# Patient Record
Sex: Male | Born: 1960 | Race: Black or African American | Hispanic: No | Marital: Married | Smoking: Never smoker
Health system: Southern US, Community
[De-identification: ages and names within clinical notes are randomized; demographics above are authoritative.]

## PROBLEM LIST (undated history)

## (undated) DIAGNOSIS — I10 Essential (primary) hypertension: Secondary | ICD-10-CM

## (undated) DIAGNOSIS — R519 Headache, unspecified: Secondary | ICD-10-CM

## (undated) DIAGNOSIS — Z973 Presence of spectacles and contact lenses: Secondary | ICD-10-CM

## (undated) DIAGNOSIS — E119 Type 2 diabetes mellitus without complications: Secondary | ICD-10-CM

## (undated) DIAGNOSIS — M1990 Unspecified osteoarthritis, unspecified site: Secondary | ICD-10-CM

## (undated) DIAGNOSIS — K5790 Diverticulosis of intestine, part unspecified, without perforation or abscess without bleeding: Secondary | ICD-10-CM

## (undated) DIAGNOSIS — M109 Gout, unspecified: Secondary | ICD-10-CM

## (undated) DIAGNOSIS — R51 Headache: Secondary | ICD-10-CM

## (undated) DIAGNOSIS — E669 Obesity, unspecified: Secondary | ICD-10-CM

## (undated) HISTORY — PX: COLONOSCOPY: SHX174

## (undated) HISTORY — DX: Essential (primary) hypertension: I10

## (undated) HISTORY — DX: Gout, unspecified: M10.9

---

## 2001-10-28 ENCOUNTER — Encounter: Payer: Self-pay | Admitting: Emergency Medicine

## 2001-10-28 ENCOUNTER — Emergency Department (HOSPITAL_COMMUNITY): Admission: EM | Admit: 2001-10-28 | Discharge: 2001-10-28 | Payer: Self-pay | Admitting: Emergency Medicine

## 2004-03-10 ENCOUNTER — Emergency Department (HOSPITAL_COMMUNITY): Admission: EM | Admit: 2004-03-10 | Discharge: 2004-03-10 | Payer: Self-pay | Admitting: Emergency Medicine

## 2004-11-16 ENCOUNTER — Ambulatory Visit: Payer: Self-pay | Admitting: Internal Medicine

## 2004-12-26 ENCOUNTER — Ambulatory Visit: Payer: Self-pay | Admitting: Internal Medicine

## 2008-07-07 ENCOUNTER — Encounter (INDEPENDENT_AMBULATORY_CARE_PROVIDER_SITE_OTHER): Payer: Self-pay | Admitting: *Deleted

## 2008-07-07 ENCOUNTER — Ambulatory Visit: Payer: Self-pay | Admitting: Internal Medicine

## 2008-07-10 LAB — CONVERTED CEMR LAB
BUN: 13 mg/dL (ref 6–23)
CO2: 27 meq/L (ref 19–32)
Calcium: 9.1 mg/dL (ref 8.4–10.5)
Chloride: 108 meq/L (ref 96–112)
Creatinine, Ser: 1 mg/dL (ref 0.4–1.5)
GFR calc Af Amer: 103 mL/min
GFR calc non Af Amer: 86 mL/min
Glucose, Bld: 78 mg/dL (ref 70–99)
Potassium: 3.9 meq/L (ref 3.5–5.1)
Sodium: 142 meq/L (ref 135–145)

## 2008-08-19 ENCOUNTER — Ambulatory Visit: Payer: Self-pay | Admitting: Internal Medicine

## 2008-08-23 ENCOUNTER — Encounter (INDEPENDENT_AMBULATORY_CARE_PROVIDER_SITE_OTHER): Payer: Self-pay | Admitting: *Deleted

## 2009-02-17 ENCOUNTER — Ambulatory Visit: Payer: Self-pay | Admitting: Internal Medicine

## 2009-08-25 ENCOUNTER — Telehealth (INDEPENDENT_AMBULATORY_CARE_PROVIDER_SITE_OTHER): Payer: Self-pay | Admitting: *Deleted

## 2010-10-15 LAB — HM COLONOSCOPY: HM Colonoscopy: NORMAL

## 2010-11-12 LAB — CONVERTED CEMR LAB
ALT: 24 U/L
AST: 17 U/L
BUN: 12 mg/dL
Basophils Absolute: 0 K/uL
Basophils Relative: 0.6 %
CO2: 30 meq/L
Calcium: 8.8 mg/dL
Chloride: 106 meq/L
Cholesterol: 170 mg/dL
Creatinine, Ser: 1 mg/dL
Eosinophils Absolute: 0.4 K/uL
Eosinophils Relative: 7 % — ABNORMAL HIGH
GFR calc Af Amer: 103 mL/min
GFR calc non Af Amer: 86 mL/min
Glucose, Bld: 96 mg/dL
HCT: 43.9 %
HDL: 27.9 mg/dL — ABNORMAL LOW
Hemoglobin: 15.2 g/dL
LDL Cholesterol: 129 mg/dL — ABNORMAL HIGH
Lymphocytes Relative: 29.3 %
MCHC: 34.6 g/dL
MCV: 92.4 fL
Monocytes Absolute: 0.2 K/uL
Monocytes Relative: 4.7 %
Neutro Abs: 3.1 K/uL
Neutrophils Relative %: 58.4 %
PSA: 0.52 ng/mL
Platelets: 216 K/uL
Potassium: 3.9 meq/L
RBC: 4.75 M/uL
RDW: 13.1 %
Sodium: 141 meq/L
TSH: 1.17 u[IU]/mL
Total CHOL/HDL Ratio: 6.1
Triglycerides: 64 mg/dL
VLDL: 13 mg/dL
WBC: 5.2 10*3/microliter

## 2010-12-08 ENCOUNTER — Encounter: Payer: Self-pay | Admitting: Internal Medicine

## 2012-10-29 ENCOUNTER — Ambulatory Visit (INDEPENDENT_AMBULATORY_CARE_PROVIDER_SITE_OTHER): Payer: 59 | Admitting: Internal Medicine

## 2012-10-29 ENCOUNTER — Encounter: Payer: Self-pay | Admitting: Internal Medicine

## 2012-10-29 VITALS — BP 142/88 | HR 91 | Temp 98.6°F | Wt 271.0 lb

## 2012-10-29 DIAGNOSIS — I10 Essential (primary) hypertension: Secondary | ICD-10-CM

## 2012-10-29 DIAGNOSIS — M79609 Pain in unspecified limb: Secondary | ICD-10-CM

## 2012-10-29 DIAGNOSIS — M79672 Pain in left foot: Secondary | ICD-10-CM

## 2012-10-29 DIAGNOSIS — M109 Gout, unspecified: Secondary | ICD-10-CM | POA: Insufficient documentation

## 2012-10-29 NOTE — Progress Notes (Signed)
  Subjective:    Patient ID: Ian Wilson, male    DOB: 11-Aug-1961, 51 y.o.   MRN: [id number]  HPI Acute visit Developed pain at the base of the left great toe 3 days ago. Does not recall any specific injury but he admits that he has been doing a lot of walking lately. As far as his history of high blood pressure, a while back he changed his diet and has not taken any medication. Ambulatory BPs are good.  Past Medical History  Diagnosis Date  . HTN (hypertension)    Past Surgical History  Procedure Date  . No past surgeries    History   Social History  . Marital Status: Married    Spouse Name: N/A    Number of Children: 1  . Years of Education: N/A   Occupational History  . Public affairs consultant     Social History Main Topics  . Smoking status: Never Smoker   . Smokeless tobacco: Never Used  . Alcohol Use: No  . Drug Use: No  . Sexually Active: Not on file   Other Topics Concern  . Not on file   Social History Narrative  . No narrative on file     Review of Systems Denies any recent increase in his weight. No fever chills. Denies numbness or a rash in the left foot. The area was slightly swollen.     Objective:   Physical Exam  General -- alert, well-developed, and overweight appearing Lungs -- normal respiratory effort, no intercostal retractions, no accessory muscle use, and normal breath sounds.   Heart-- normal rate, regular rhythm, no murmur, and no gallop.   Extremities--  no pretibial edema bilaterally ; good bilateral pedal pulses right foot normal to inspection and palpation. Left foot without redness, swelling or warmness. He is a slightly tender at the base of the great toe (only tender at the plantar aspect) Psych-- Cognition and judgment appear intact. Alert and cooperative with normal attention span and concentration.  not anxious appearing and not depressed appearing.      Assessment & Plan:

## 2012-10-29 NOTE — Patient Instructions (Addendum)
Get the XR at [facility] [place], corner of [address] (10 minutes form here); they are open 24/7 [address] [facility]  [place], [place] [address] [phone]  Motrin 200 mg 2 tablets every 6 hours as needed for pain. Always take it with food. Watch for stomach side effects (gastritis): nausea, stomach pain, change in the color of stools. Call if not better in few days Schedule a physical at your convenience

## 2012-10-29 NOTE — Assessment & Plan Note (Signed)
Last seen 3 years ago, he was taking benazepril and amlodipine, he quit a while back, he changed his diet and reports that his BP is better, usually less than 140/85.

## 2012-10-29 NOTE — Assessment & Plan Note (Signed)
Acute visit pain, sprain versus gout versus others like a stress fracture. Plan: Check a uric acid and x-ray Treat with Motrin, if not better will need further eval

## 2012-10-30 ENCOUNTER — Encounter: Payer: Self-pay | Admitting: Internal Medicine

## 2012-10-30 LAB — BASIC METABOLIC PANEL
BUN: 15 mg/dL (ref 6–23)
CO2: 27 mEq/L (ref 19–32)
Calcium: 8.8 mg/dL (ref 8.4–10.5)
Chloride: 104 mEq/L (ref 96–112)
Creatinine, Ser: 1.2 mg/dL (ref 0.4–1.5)
GFR: 79.25 mL/min (ref >60.00)
Glucose, Bld: 82 mg/dL (ref 70–99)
Potassium: 3.9 mEq/L (ref 3.5–5.1)
Sodium: 139 mEq/L (ref 135–145)

## 2012-10-30 LAB — URIC ACID: Uric Acid, Serum: 8.3 mg/dL — ABNORMAL HIGH (ref 4.0–7.8)

## 2012-11-06 ENCOUNTER — Encounter: Payer: Self-pay | Admitting: *Deleted

## 2012-11-06 MED ORDER — PREDNISONE 10 MG PO TABS: ORAL_TABLET | ORAL | Status: DC

## 2012-11-06 NOTE — Addendum Note (Signed)
Addended by: Edwena Felty T on: 11/06/2012 04:00 PM   Modules accepted: Orders

## 2012-12-16 ENCOUNTER — Encounter: Payer: 59 | Admitting: Internal Medicine

## 2013-01-26 ENCOUNTER — Encounter: Payer: 59 | Admitting: Internal Medicine

## 2013-02-04 ENCOUNTER — Ambulatory Visit (INDEPENDENT_AMBULATORY_CARE_PROVIDER_SITE_OTHER): Payer: 59 | Admitting: Internal Medicine

## 2013-02-04 ENCOUNTER — Encounter: Payer: Self-pay | Admitting: Internal Medicine

## 2013-02-04 VITALS — BP 136/86 | HR 95 | Temp 98.8°F | Wt 282.0 lb

## 2013-02-04 DIAGNOSIS — M109 Gout, unspecified: Secondary | ICD-10-CM

## 2013-02-04 MED ORDER — INDOMETHACIN 25 MG PO CAPS: 25.0000 mg | ORAL_CAPSULE | Freq: Three times a day (TID) | ORAL | Status: DC

## 2013-02-04 MED ORDER — COLCHICINE 0.6 MG PO TABS: 0.6000 mg | ORAL_TABLET | Freq: Two times a day (BID) | ORAL | Status: DC | PRN

## 2013-02-04 NOTE — Assessment & Plan Note (Signed)
Symptoms consistent with gout: Explained the patient what gout is, treatment should be weight loss, appropriate diet, as needed colchicine or Indocin. Information about appropriate diet  provided. If symptoms recur, he will need allopurinol. Also, recommended to come back in one month for a complete checkup

## 2013-02-04 NOTE — Progress Notes (Signed)
  Subjective:    Patient ID: Ian Wilson, male    DOB: 10-23-1960, 52 y.o.   MRN: [id number]  HPI Acute visit Was seen with pain few months ago, at that time due to lack of insurance did not have x-rays or took prednisone. Eventually he felt better. Symptoms resurface 4 days ago: Swelling and pain in the right food, at the base of the great toe He did get prednisone 3 days ago and pain has decreased significantly  Past Medical History  Diagnosis Date  . HTN (hypertension)   . Gout    Past Surgical History  Procedure Laterality Date  . No past surgeries      Review of Systems No fever chills No injuries that he can tell    Objective:   Physical Exam General -- alert, well-developed, No apparent distress Extremities-- no pretibial edema bilaterally; Swelling, mild warmness and tenderness around the base of the  great toe on the right. No deformities or fluctuance.  Neurologic-- alert & oriented X3 and strength normal in all extremities. Psych-- Cognition and judgment appear intact. Alert and cooperative with normal attention span and concentration.  not anxious appearing and not depressed appearing.       Assessment & Plan:

## 2013-02-04 NOTE — Patient Instructions (Addendum)
Finish prednisone Use colchicine twice a day as needed; if too expensive, use indocin instead (always take it with food. Watch for stomach side effects :gastritis, nausea, stomach pain, change in the color of stools.) Follow the diet  Schedule a physical in 1 month  Gout Gout is an inflammatory condition (arthritis) caused by a buildup of uric acid crystals in the joints. Uric acid is a chemical that is normally present in the blood. Under some circumstances, uric acid can form into crystals in your joints. This causes joint redness, soreness, and swelling (inflammation). Repeat attacks are common. Over time, uric acid crystals can form into masses (tophi) near a joint, causing disfigurement. Gout is treatable and often preventable. CAUSES  The disease begins with elevated levels of uric acid in the blood. Uric acid is produced by your body when it breaks down a naturally found substance called purines. This also happens when you eat certain foods such as meats and fish. Causes of an elevated uric acid level include:  Being passed down from parent to child (heredity).  Diseases that cause increased uric acid production (obesity, psoriasis, some cancers).  Excessive alcohol use.  Diet, especially diets rich in meat and seafood.  Medicines, including certain cancer-fighting drugs (chemotherapy), diuretics, and aspirin.  Chronic kidney disease. The kidneys are no longer able to remove uric acid well.  Problems with metabolism. Conditions strongly associated with gout include:  Obesity.  High blood pressure.  High cholesterol.  Diabetes. Not everyone with elevated uric acid levels gets gout. It is not understood why some people get gout and others do not. Surgery, joint injury, and eating too much of certain foods are some of the factors that can lead to gout. SYMPTOMS   An attack of gout comes on quickly. It causes intense pain with redness, swelling, and warmth in a joint.  Fever  can occur.  Often, only one joint is involved. Certain joints are more commonly involved:  Base of the big toe.  Knee.  Ankle.  Wrist.  Finger. Without treatment, an attack usually goes away in a few days to weeks. Between attacks, you usually will not have symptoms, which is different from many other forms of arthritis. DIAGNOSIS  Your caregiver will suspect gout based on your symptoms and exam. Removal of fluid from the joint (arthrocentesis) is done to check for uric acid crystals. Your caregiver will give you a medicine that numbs the area (local anesthetic) and use a needle to remove joint fluid for exam. Gout is confirmed when uric acid crystals are seen in joint fluid, using a special microscope. Sometimes, blood, urine, and X-ray tests are also used. TREATMENT  There are 2 phases to gout treatment: treating the sudden onset (acute) attack and preventing attacks (prophylaxis). Treatment of an Acute Attack  Medicines are used. These include anti-inflammatory medicines or steroid medicines.  An injection of steroid medicine into the affected joint is sometimes necessary.  The painful joint is rested. Movement can worsen the arthritis.  You may use warm or cold treatments on painful joints, depending which works best for you.  Discuss the use of coffee, vitamin C, or cherries with your caregiver. These may be helpful treatment options. Treatment to Prevent Attacks After the acute attack subsides, your caregiver may advise prophylactic medicine. These medicines either help your kidneys eliminate uric acid from your body or decrease your uric acid production. You may need to stay on these medicines for a very long time. The early phase of  treatment with prophylactic medicine can be associated with an increase in acute gout attacks. For this reason, during the first few months of treatment, your caregiver may also advise you to take medicines usually used for acute gout treatment. Be  sure you understand your caregiver's directions. You should also discuss dietary treatment with your caregiver. Certain foods such as meats and fish can increase uric acid levels. Other foods such as dairy can decrease levels. Your caregiver can give you a list of foods to avoid. HOME CARE INSTRUCTIONS   Do not take aspirin to relieve pain. This raises uric acid levels.  Only take over-the-counter or prescription medicines for pain, discomfort, or fever as directed by your caregiver.  Rest the joint as much as possible. When in bed, keep sheets and blankets off painful areas.  Keep the affected joint raised (elevated).  Use crutches if the painful joint is in your leg.  Drink enough water and fluids to keep your urine clear or pale yellow. This helps your body get rid of uric acid. Do not drink alcoholic beverages. They slow the passage of uric acid.  Follow your caregiver's dietary instructions. Pay careful attention to the amount of protein you eat. Your daily diet should emphasize fruits, vegetables, whole grains, and fat-free or low-fat milk products.  Maintain a healthy body weight. SEEK MEDICAL CARE IF:   You have an oral temperature above 102 F (38.9 C).  You develop diarrhea, vomiting, or any side effects from medicines.  You do not feel better in 24 hours, or you are getting worse. SEEK IMMEDIATE MEDICAL CARE IF:   Your joint becomes suddenly more tender and you have:  Chills.  An oral temperature above 102 F (38.9 C), not controlled by medicine. MAKE SURE YOU:   Understand these instructions.  Will watch your condition.  Will get help right away if you are not doing well or get worse. Document Released: 09/28/2000 Document Revised: 12/24/2011 Document Reviewed: 01/09/2010 [facility] Patient Information 2013 [place], [place].

## 2013-02-05 ENCOUNTER — Telehealth: Payer: Self-pay | Admitting: Internal Medicine

## 2013-02-05 NOTE — Telephone Encounter (Signed)
Spoke to pt & he states both medicines were in the $300 range.

## 2013-02-05 NOTE — Telephone Encounter (Signed)
Patient's wife called stating the medication Dr. Drue Novel prescribed for his gout is $300. The pharmacist suggested to switch to allopurinol 100mg  or 300mg  bc the cost is $4. Please advise.

## 2013-02-05 NOTE — Telephone Encounter (Signed)
Must be a error, indometacin is a generic, please call the pharmacist and clarify

## 2013-02-05 NOTE — Telephone Encounter (Signed)
I gave him 2 prescriptions, instead of colchicine which is expensive he can use Indocin.

## 2013-02-06 NOTE — Telephone Encounter (Signed)
Spoke to pharmacy & the indomethacin is $21.75. I called & spoke to pt to make aware.

## 2013-03-10 ENCOUNTER — Encounter: Payer: 59 | Admitting: Internal Medicine

## 2013-04-10 ENCOUNTER — Telehealth: Payer: Self-pay | Admitting: Internal Medicine

## 2013-04-10 MED ORDER — COLCHICINE 0.6 MG PO TABS: 0.6000 mg | ORAL_TABLET | Freq: Two times a day (BID) | ORAL | Status: DC | PRN

## 2013-04-10 NOTE — Telephone Encounter (Signed)
Pt would like a refill on colchicine 0.6 MG tablet Pharmacy: [facility] [address].

## 2013-04-10 NOTE — Telephone Encounter (Signed)
Refill done.  

## 2013-04-13 ENCOUNTER — Telehealth: Payer: Self-pay | Admitting: Internal Medicine

## 2013-04-13 MED ORDER — ALLOPURINOL 100 MG PO TABS: 100.0000 mg | ORAL_TABLET | Freq: Every day | ORAL | Status: DC

## 2013-04-13 NOTE — Telephone Encounter (Signed)
Patient's wife states that the Colchicine Rx that was sent to [facility] pharmacy is $400 with insurance. She is requesting an alternative of Allopurinol 100mg  or 300mg  and would like a 90-day supply because it will only be $10.

## 2013-04-13 NOTE — Telephone Encounter (Signed)
Allopurinol is not interchangeable with colchicine----- allopurinol will decrease the uric acid but it will take a while to work allopurinol 100 mg 1 po qd #30  --  Recheck uric acid in 1 month and f/u Dr Drue Novel

## 2013-04-13 NOTE — Telephone Encounter (Signed)
lmovm for pt to return call.  

## 2013-04-13 NOTE — Telephone Encounter (Signed)
Please advise 

## 2013-04-13 NOTE — Telephone Encounter (Signed)
Discussed with pt's wife, rx sent to pharmacy.

## 2013-11-18 ENCOUNTER — Encounter: Payer: Self-pay | Admitting: Internal Medicine

## 2013-11-18 ENCOUNTER — Ambulatory Visit (INDEPENDENT_AMBULATORY_CARE_PROVIDER_SITE_OTHER): Payer: 59 | Admitting: Internal Medicine

## 2013-11-18 VITALS — BP 152/90 | HR 100 | Temp 97.9°F | Wt 284.0 lb

## 2013-11-18 DIAGNOSIS — M1990 Unspecified osteoarthritis, unspecified site: Secondary | ICD-10-CM

## 2013-11-18 DIAGNOSIS — I10 Essential (primary) hypertension: Secondary | ICD-10-CM

## 2013-11-18 MED ORDER — AMLODIPINE BESYLATE 5 MG PO TABS: 5.0000 mg | ORAL_TABLET | Freq: Every day | ORAL | Status: DC

## 2013-11-18 MED ORDER — HYDROCODONE-ACETAMINOPHEN 5-325 MG PO TABS: 1.0000 | ORAL_TABLET | Freq: Three times a day (TID) | ORAL | Status: DC | PRN

## 2013-11-18 NOTE — Progress Notes (Signed)
   Subjective:    Patient ID: Ian SiresGeorge D Wilson, male    DOB: 1961/04/16, 53 y.o.   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 I also note that he is not taking his medication, he has a history of hypertension, no recent ambulatory BPs. Not taking gout meds , no recent flareups.  Past Medical History  Diagnosis Date  . HTN (hypertension)   . Gout    Past Surgical History  Procedure Laterality Date  . No past surgeries      Review of Systems Currently unable to exercise due to to pain. Occasional swelling in the left popliteal area. Occasionally the left knee get warm and hot. Denies any headache, nausea vomiting. No chest pain, difficulty breathing or edema    Objective:   Physical Exam BP 152/90  Pulse 100  Temp(Src) 97.9 F (36.6 C)  Wt 284 lb (128.822 kg)  SpO2 97% General -- alert, well-developed, NAD.   Lungs -- normal respiratory effort, no intercostal retractions, no accessory muscle use, and normal breath sounds.  Heart-- normal rate, regular rhythm, no murmur.   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 Left knee w/ slightly limited range of motion, no actual effusion, redness or warmness. He walks with some difficulty. Neurologic--  alert & oriented X3. Speech normal, gait normal, strength normal in all extremities.  Psych-- Cognition and judgment appear intact. Cooperative with normal attention span and concentration. No anxious or depressed appearing.      Assessment & Plan:

## 2013-11-18 NOTE — Patient Instructions (Signed)
Take amlodipine everyday  Check the  blood pressure 2 or 3 times a   week be sure it is between 110/60 and 140/85. Ideal blood pressure is 120/80. If it is consistently higher or lower, let me know  Come back in a month for a complete physical exam, fasting  Tylenol  500 mg OTC 2 tabs a day every 8 hours as needed for pain For pain at night okay to use hydrocodone (it has tylenol on it already). Will make you sleepy.     Sodium-Controlled Diet Sodium is a mineral. It is found in many foods. Sodium may be found naturally or added during the making of a food. The most common form of sodium is salt, which is made up of sodium and chloride. Reducing your sodium intake involves changing your eating habits. The following guidelines will help you reduce the sodium in your diet:  Stop using the salt shaker.  Use salt sparingly in cooking and baking.  Substitute with sodium-free seasonings and spices.  Do not use a salt substitute (potassium chloride) without your caregiver's permission.  Include a variety of fresh, unprocessed foods in your diet.  Limit the use of processed and convenience foods that are high in sodium. USE THE FOLLOWING FOODS SPARINGLY: Breads/Starches  Commercial bread stuffing, commercial pancake or waffle mixes, coating mixes. Waffles. Croutons. Prepared (boxed or frozen) potato, rice, or noodle mixes that contain salt or sodium. Salted Jamaica[place] fries or hash browns. Salted popcorn, breads, crackers, chips, or snack foods. Vegetables  Vegetables canned with salt or prepared in cream, butter, or cheese sauces. Sauerkraut. Tomato or vegetable juices canned with salt.  Fresh vegetables are allowed if rinsed thoroughly. Fruit  Fruit is okay to eat. Meat and Meat Substitutes  Salted or smoked meats, such as bacon or Canadian bacon, chipped or corned beef, hot dogs, salt pork, luncheon meats, pastrami, ham, or sausage. Canned or smoked fish, poultry, or meat. Processed  cheese or cheese spreads, blue or Roquefort cheese. Battered or frozen fish products. Prepared spaghetti sauce. Baked beans. Reuben sandwiches. Salted nuts. Caviar. Milk  Limit buttermilk to 1 cup per week. Soups and Combination Foods  Bouillon cubes, canned or dried soups, broth, consomm. Convenience (frozen or packaged) dinners with more than 600 mg sodium. Pot pies, pizza, Asian food, fast food cheeseburgers, and specialty sandwiches. Desserts and Sweets  Regular (salted) desserts, pie, commercial fruit snack pies, commercial snack cakes, canned puddings.  Eat desserts and sweets in moderation. Fats and Oils  Gravy mixes or canned gravy. No more than 1 to 2 tbs of salad dressing. Chip dips.  Eat fats and oils in moderation. Beverages  See those listed under the vegetables and milk groups. Condiments  Ketchup, mustard, meat sauces, salsa, regular (salted) and lite soy sauce or mustard. Dill pickles, olives, meat tenderizer. Prepared horseradish or pickle relish. Dutch-processed cocoa. Baking powder or baking soda used medicinally. Worcestershire sauce. "Light" salt. Salt substitute, unless approved by your caregiver. Document Released: 03/23/2002 Document Revised: 12/24/2011 Document Reviewed: 10/24/2009 [facility] Patient Information 2014 [place], [place].

## 2013-11-18 NOTE — Assessment & Plan Note (Signed)
Chronic knee pain, likely DJD. Plan: Refer to [facility], he was seen there before Pain control with Tylenol and hydrocodone For now we'll try to avoid Motrin due to elevated BP.

## 2013-11-18 NOTE — Assessment & Plan Note (Signed)
Off amlodipine for a while, no recent ambulatory BPs, BP today elevated. Patient is educated about the risks of asymptomatic elevated BP including  CAD and strokes. Low-salt diet encouraged Refill amlodipine Recommend to come back in one month for a complete physical exam fasting

## 2013-11-18 NOTE — Progress Notes (Signed)
Pre visit review using our clinic review tool, if applicable. No additional management support is needed unless otherwise documented below in the visit note. 

## 2013-11-20 ENCOUNTER — Telehealth: Payer: Self-pay | Admitting: Internal Medicine

## 2013-11-20 NOTE — Telephone Encounter (Signed)
Relevant patient education assigned to patient using Emmi. ° °

## 2013-12-17 ENCOUNTER — Encounter: Payer: 59 | Admitting: Internal Medicine

## 2013-12-24 ENCOUNTER — Telehealth: Payer: Self-pay

## 2013-12-24 NOTE — Telephone Encounter (Signed)
Left message for call back Non identifiable  Flu--declined Tdap--2009 PSA--08/2008--0.52

## 2013-12-25 ENCOUNTER — Encounter: Payer: Self-pay | Admitting: Internal Medicine

## 2013-12-25 ENCOUNTER — Ambulatory Visit (INDEPENDENT_AMBULATORY_CARE_PROVIDER_SITE_OTHER): Payer: 59 | Admitting: Internal Medicine

## 2013-12-25 VITALS — BP 144/88 | HR 80 | Temp 98.0°F | Ht 70.2 in | Wt 286.0 lb

## 2013-12-25 DIAGNOSIS — Z0000 Encounter for general adult medical examination without abnormal findings: Secondary | ICD-10-CM

## 2013-12-25 DIAGNOSIS — M109 Gout, unspecified: Secondary | ICD-10-CM

## 2013-12-25 DIAGNOSIS — I10 Essential (primary) hypertension: Secondary | ICD-10-CM

## 2013-12-25 LAB — CBC WITH DIFFERENTIAL/PLATELET
BASOS PCT: 0.3 % (ref 0.0–3.0)
Basophils Absolute: 0 10*3/uL (ref 0.0–0.1)
EOS ABS: 0.2 10*3/uL (ref 0.0–0.7)
Eosinophils Relative: 4 % (ref 0.0–5.0)
HCT: 45.9 % (ref 39.0–52.0)
HEMOGLOBIN: 15.4 g/dL (ref 13.0–17.0)
LYMPHS ABS: 1.7 10*3/uL (ref 0.7–4.0)
Lymphocytes Relative: 31 % (ref 12.0–46.0)
MCHC: 33.5 g/dL (ref 30.0–36.0)
MCV: 91.6 fl (ref 78.0–100.0)
MONO ABS: 0.4 10*3/uL (ref 0.1–1.0)
Monocytes Relative: 6.6 % (ref 3.0–12.0)
NEUTROS ABS: 3.1 10*3/uL (ref 1.4–7.7)
Neutrophils Relative %: 58.1 % (ref 43.0–77.0)
Platelets: 226 10*3/uL (ref 150.0–400.0)
RBC: 5.02 Mil/uL (ref 4.22–5.81)
RDW: 13.7 % (ref 11.5–14.6)
WBC: 5.4 10*3/uL (ref 4.5–10.5)

## 2013-12-25 LAB — COMPREHENSIVE METABOLIC PANEL
ALK PHOS: 62 U/L (ref 39–117)
ALT: 22 U/L (ref 0–53)
AST: 17 U/L (ref 0–37)
Albumin: 4.1 g/dL (ref 3.5–5.2)
BUN: 12 mg/dL (ref 6–23)
CO2: 28 mEq/L (ref 19–32)
CREATININE: 1.1 mg/dL (ref 0.4–1.5)
Calcium: 9 mg/dL (ref 8.4–10.5)
Chloride: 106 mEq/L (ref 96–112)
GFR: 87.81 mL/min (ref >60.00)
Glucose, Bld: 98 mg/dL (ref 70–99)
POTASSIUM: 3.8 meq/L (ref 3.5–5.1)
Sodium: 140 mEq/L (ref 135–145)
Total Bilirubin: 0.6 mg/dL (ref 0.3–1.2)
Total Protein: 6.9 g/dL (ref 6.0–8.3)

## 2013-12-25 LAB — LIPID PANEL
CHOL/HDL RATIO: 6
Cholesterol: 212 mg/dL — ABNORMAL HIGH (ref 0–200)
HDL: 33.7 mg/dL — ABNORMAL LOW (ref >39.00)
LDL CALC: 166 mg/dL — AB (ref 0–99)
Triglycerides: 63 mg/dL (ref 0.0–149.0)
VLDL: 12.6 mg/dL (ref 0.0–40.0)

## 2013-12-25 LAB — TSH: TSH: 1.02 u[IU]/mL (ref 0.35–5.50)

## 2013-12-25 LAB — PSA: PSA: 0.41 ng/mL (ref 0.10–4.00)

## 2013-12-25 LAB — URIC ACID: URIC ACID, SERUM: 7.9 mg/dL — AB (ref 4.0–7.8)

## 2013-12-25 MED ORDER — AMLODIPINE BESYLATE 10 MG PO TABS: 10.0000 mg | ORAL_TABLET | Freq: Every day | ORAL | Status: DC

## 2013-12-25 NOTE — Assessment & Plan Note (Signed)
BP elevated x2 here at the office, ambulatory BPs range from the mid 120s to mid 140s. Plan: Increase amlodipine from 5 mg to 10 mg, watch for edema, continue monitoring his BP.

## 2013-12-25 NOTE — Telephone Encounter (Signed)
Unable to reach prior to visit  

## 2013-12-25 NOTE — Assessment & Plan Note (Addendum)
Tdap--2009   Had a cscope [facility] no report ~ 2012, pt was told normal, asked pt to get the report Hemoccult negative today. + FH heart disease mother at age 52 + FH father prostate cancer at age 52. Patient is doing better with diet, he has been unable to exercise recently due to knee pain but he will see the  orthopedic doctor today. Labs. EKG nsr

## 2013-12-25 NOTE — Patient Instructions (Signed)
Get your blood work before you leave   Increase amlodipine to 10 mg. Check the  blood pressure   Weekly  be sure it is between 110/60 and 140/85. Ideal blood pressure is 120/80. If it is consistently higher or lower, let me know  Next visit is for routine check up regards your blood pressure   in 6 months  No need to come back fasting Please make an appointment    Please get   the report of the colonoscopy.

## 2013-12-25 NOTE — Progress Notes (Signed)
   Subjective:    Patient ID: Ian SiresGeorge D Wilson, male    DOB: 12/19/1960, 53 y.o.   MRN: [id number]  DOS:  12/25/2013 Type of  visit: CPX    ROS Diet-- getting better  Exercise-- none recently d/t knee pain  No  CP, SOB, no lower extremity edema Denies  nausea, vomiting diarrhea Denies  blood in the stools No GERD  Sx. (-) cough, sputum production (-) wheezing, chest congestion  No dysuria, gross hematuria, difficulty urinating   No anxiety, depression   Past Medical History  Diagnosis Date  . HTN (hypertension)   . Gout     Past Surgical History  Procedure Laterality Date  . No past surgeries      History   Social History  . Marital Status: Married    Spouse Name: N/A    Number of Children: 1  . Years of Education: N/A   Occupational History  . Administrator, Civil Servicesales consultant , [employer]    Social History Main Topics  . Smoking status: Never Smoker   . Smokeless tobacco: Never Used  . Alcohol Use: No  . Drug Use: No  . Sexual Activity: Not on file   Other Topics Concern  . Not on file   Social History Narrative   Lives w/ wife    Family History  Problem Relation Age of Onset  . CAD Mother     CABG in her 7450s  . Stroke Mother   . Diabetes Mother   . Colon cancer Neg Hx   . Prostate cancer Father     dx in his 6970s       Medication List       This list is accurate as of: 12/25/13  6:44 PM.  Always use your most recent med list.               amLODipine 10 MG tablet  Commonly known as:  NORVASC  Take 1 tablet (10 mg total) by mouth daily.           Objective:   Physical Exam BP 144/88  Pulse 80  Temp(Src) 98 F (36.7 C)  Ht 5' 10.2" (1.783 m)  Wt 286 lb (129.729 kg)  BMI 40.81 kg/m2  SpO2 98% General -- alert, well-developed, NAD.  Neck --no thyromegaly  HEENT-- Not pale.   Lungs -- normal respiratory effort, no intercostal retractions, no accessory muscle use, and normal breath sounds.  Heart-- normal rate, regular rhythm, no murmur.   Abdomen-- Not distended, good bowel sounds,soft, non-tender. Rectal-- No external abnormalities noted. Normal sphincter tone. No rectal masses or tenderness. Brown stool, Hemoccult negative  Prostate--Prostate gland firm and smooth, no enlargement, nodularity, tenderness, mass, asymmetry or induration. Extremities-- no pretibial edema bilaterally  Neurologic--  alert & oriented X3. Speech normal, gait normal, strength normal in all extremities.  Psych-- Cognition and judgment appear intact. Cooperative with normal attention span and concentration. No anxious or depressed appearing.     Assessment & Plan:

## 2013-12-25 NOTE — Progress Notes (Signed)
Pre visit review using our clinic review tool, if applicable. No additional management support is needed unless otherwise documented below in the visit note. 

## 2013-12-28 ENCOUNTER — Telehealth: Payer: Self-pay | Admitting: Internal Medicine

## 2013-12-28 ENCOUNTER — Encounter: Payer: Self-pay | Admitting: *Deleted

## 2013-12-28 MED ORDER — AMLODIPINE BESYLATE 10 MG PO TABS: 10.0000 mg | ORAL_TABLET | Freq: Every day | ORAL | Status: DC

## 2013-12-28 NOTE — Addendum Note (Signed)
Addended by: Eustace QuailEABOLD, Nica Friske J on: 12/28/2013 05:22 PM   Modules accepted: Orders

## 2013-12-28 NOTE — Telephone Encounter (Signed)
Patient states the pharmacy did not receive rx for amlodipine 10 mg. Pt uses [facility] [address] [place].

## 2013-12-28 NOTE — Telephone Encounter (Signed)
rx sent again

## 2014-01-25 ENCOUNTER — Encounter: Payer: Self-pay | Admitting: Internal Medicine

## 2014-01-25 ENCOUNTER — Ambulatory Visit (INDEPENDENT_AMBULATORY_CARE_PROVIDER_SITE_OTHER): Payer: 59 | Admitting: Internal Medicine

## 2014-01-25 VITALS — BP 137/84 | HR 100 | Temp 98.4°F | Wt 289.0 lb

## 2014-01-25 DIAGNOSIS — I10 Essential (primary) hypertension: Secondary | ICD-10-CM

## 2014-01-25 DIAGNOSIS — R197 Diarrhea, unspecified: Secondary | ICD-10-CM

## 2014-01-25 MED ORDER — HYOSCYAMINE SULFATE 0.125 MG SL SUBL: 0.1250 mg | SUBLINGUAL_TABLET | SUBLINGUAL | Status: DC | PRN

## 2014-01-25 NOTE — Progress Notes (Signed)
   Subjective:    Patient ID: Ian Wilson, male    DOB: [birth year], 53 y.o.   MRN: [id number]  DOS:  01/25/2014 Type of  visit: Acute visit, here w/  Wife Symptoms started last week: Diarrhea on and off, abdominal cramps associated with sweats.Denies nausea or vomiting. No blood in the stools. Has at least 2 coworkers with similar symptoms. He took off today from work because he wasn't feeling 100% although has improved compared to the weekend.   ROS No fevers per se. Denies any respiratory symptoms such as cough, runny nose, sore throat or sputum production. No chest pain no difficulty breathing. No myalgias.   Past Medical History  Diagnosis Date  . HTN (hypertension)   . Gout     Past Surgical History  Procedure Laterality Date  . No past surgeries      History   Social History  . Marital Status: Married    Spouse Name: N/A    Number of Children: 1  . Years of Education: N/A   Occupational History  . Administrator, Civil Servicesales consultant , [employer]    Social History Main Topics  . Smoking status: Never Smoker   . Smokeless tobacco: Never Used  . Alcohol Use: No  . Drug Use: No  . Sexual Activity: Not on file   Other Topics Concern  . Not on file   Social History Narrative   Lives w/ wife         Medication List       This list is accurate as of: 01/25/14  5:46 PM.  Always use your most recent med list.               amLODipine 10 MG tablet  Commonly known as:  NORVASC  Take 1 tablet (10 mg total) by mouth daily.     hyoscyamine 0.125 MG SL tablet  Commonly known as:  LEVSIN/SL  Place 1 tablet (0.125 mg total) under the tongue every 4 (four) hours as needed for cramping.     meloxicam 15 MG tablet  Commonly known as:  MOBIC           Objective:   Physical Exam BP 137/84  Pulse 100  Temp(Src) 98.4 F (36.9 C)  Wt 289 lb (131.09 kg)  SpO2 98%  General -- alert, well-developed, NAD.   HEENT-- Not pale.   Lungs -- normal respiratory effort,  no intercostal retractions, no accessory muscle use, and normal breath sounds.  Heart-- normal rate, regular rhythm, no murmur.  Abdomen-- Not distended, increased bowel sounds,soft, non-tender. No rebound or rigidity.   Extremities-- no pretibial edema bilaterally  Neurologic--  alert & oriented X3. Speech normal, gait normal, strength normal in all extremities.  Psych-- Cognition and judgment appear intact. Cooperative with normal attention span and concentration. No anxious or depressed appearing.        Assessment & Plan:   Acute diarrhea , Overall symptoms are decreasing, recommend conservative treatment. See instructions.

## 2014-01-25 NOTE — Patient Instructions (Signed)
Rest, drink plenty of clear fluids Ok to take OTC imodium Follow a bland diet until you get better If you have cramps, take levsin as needed  Call if no better in 2-3 days, call also id symptoms severe, fever, blood in the stools

## 2014-01-25 NOTE — Assessment & Plan Note (Signed)
See previous entry, amlodipine dose increased, BP today looks better, ambulatory BPs also in the  130/80 range.

## 2014-02-26 ENCOUNTER — Telehealth: Payer: Self-pay | Admitting: *Deleted

## 2014-02-26 NOTE — Telephone Encounter (Signed)
LMOM @ (9:53am) asking the pt to RTC regarding med refill request.//AB/CMA

## 2014-03-01 NOTE — Telephone Encounter (Signed)
Spoke with the pt's wife(Marcia) and informed her that we received a refill request from [facility] , and we needed to know if the [facility] is [facility]  She stated that we will need to disregard the request because [facility].  She stated that she asked [facility] not to send any request until asked to, but they did.  Informed her to call [facility] and inform them that they will not be using them for the pt's meds.  She understood and agreed.//AB/CMA  Spoke with the pt on Friday (02-26-14) regarding refill request from [facility].  Pt stated that he does not think he is using [facility].  Pt asked if I would call his wife(Marcia), and talk with her regarding this.//AB/CMA

## 2014-03-01 NOTE — Telephone Encounter (Signed)
Pt's wife, Leta JunglingMarcia, called back. Please return call

## 2014-04-13 ENCOUNTER — Ambulatory Visit (INDEPENDENT_AMBULATORY_CARE_PROVIDER_SITE_OTHER): Payer: 59 | Admitting: Internal Medicine

## 2014-04-13 ENCOUNTER — Encounter: Payer: Self-pay | Admitting: Internal Medicine

## 2014-04-13 VITALS — BP 128/86 | HR 110 | Temp 97.8°F | Wt 289.0 lb

## 2014-04-13 DIAGNOSIS — R42 Dizziness and giddiness: Secondary | ICD-10-CM

## 2014-04-13 DIAGNOSIS — R197 Diarrhea, unspecified: Secondary | ICD-10-CM

## 2014-04-13 NOTE — Progress Notes (Signed)
Pre visit review using our clinic review tool, if applicable. No additional management support is needed unless otherwise documented below in the visit note. 

## 2014-04-13 NOTE — Patient Instructions (Signed)
Take LACTAID OTC if you eat dairy products, if no better let me know      Lactose Intolerance, Adult Lactose intolerance is when the body is not able to digest lactose, a sugar found in milk and milk products. Lactose intolerance is caused by your body not producing enough of the enzyme lactase. When there is not enough lactase to digest the amount of lactose consumed, discomfort may be felt. Lactose intolerance is not a milk allergy. For most people, lactase deficiency is a condition that develops naturally over time. After about the age of 2, the body begins to produce less lactase. But many people may not experience symptoms until they are much older. CAUSES Things that can cause you to be lactose intolerant include:  Aging.  Being born without the ability to make lactase.  Certain digestive diseases.  Injuries to the small intestine. SYMPTOMS   Feeling sick to your stomach (nauseous).  Diarrhea.  Cramps.  Bloating.  Gas. Symptoms usually show up a half hour or 2 hours after eating or drinking products containing lactose. TREATMENT  No treatment can improve the body's ability to produce lactase. However, symptoms can be controlled through diet. A medicine may be given to you to take when you consume lactose-containing foods or drinks. The medicine contains the lactase enzyme, which help the body digest lactose better. HOME CARE INSTRUCTIONS  Eat or drink dairy products as told by your caregiver or dietician.  Take all medicine as directed by your caregiver.  Find lactose-free or lactose-reduced products at your local grocery store.  Talk to your caregiver or dietician to decide if you need any dietary supplements. The following is the amount of calcium needed from the diet:  19 to 50 years: 1000 mg  Over 50 years: 1200 mg Calcium and Lactose in Common Foods Non-Dairy Products / Calcium Content (mg)  Calcium-fortified orange juice, 1 cup / 308 to 344 mg  Sardines,  with edible bones, 3 oz / 270 mg  Salmon, canned, with edible bones, 3 oz / 205 mg  Soymilk, fortified, 1 cup / 200 mg  Broccoli (raw), 1 cup / 90 mg  Orange, 1 medium / 50 mg  Pinto beans,  cup / 40 mg  Tuna, canned, 3 oz / 10 mg  Lettuce greens,  cup / 10 mg Dairy Products / Calcium Content (mg) / Lactose Content (g)  Yogurt, plain, low-fat, 1 cup / 415 mg / 5 g  Milk, reduced fat, 1 cup / 295 mg / 11 g  Swiss cheese, 1 oz / 270 mg / 1 g  Ice cream,  cup / 85 mg / 6 g  Cottage cheese,  cup / 75 mg / 2 to 3 g SEEK MEDICAL CARE IF: You have no relief from your symptoms. Document Released: 10/01/2005 Document Revised: 12/24/2011 Document Reviewed: 12/29/2010 [facility] Patient Information 2015 [place]. This information is not intended to replace advice given to you by your health care Tivis Wherry. Make sure you discuss any questions you have with your health care Josedaniel Haye.

## 2014-04-13 NOTE — Assessment & Plan Note (Signed)
Occasional diarrhea, Had severe symptoms after a milkshake, occasionally symptoms when he eats crackles which is. Lactose intolerance? He had a reportedly negative colonoscopy 3 years ago Plan: Trial with lact aid, see instructions, call if no improving

## 2014-04-13 NOTE — Progress Notes (Signed)
   Subjective:    Patient ID: Ian SiresGeorge D Wilson, male    DOB: 1961/08/06, 53 y.o.   MRN: [id number]  DOS:  04/13/2014 Type of  Visit: acute, her w/ wife History: --Here for a followup regards dizziness. A week ago he woke up, was quite dizzy, symptoms were on and off, triggered by standing or sitting up or down.  + association w/ nausea, no vomiting. He went to another office,records reviewed, CMP negative, EKGs reportedly normal, he was prescribed Antivert. No further episodes since then. --Also, wife reports on and off episodes of loose stools and diarrhea. No constipation, usually related to certain foods, had a bad episode after eating a milkshake  ROS No recent URI symptoms No chest pain or palpitations Denies headaches, diplopia, slurred  speech or motor deficits  Past Medical History  Diagnosis Date  . HTN (hypertension)   . Gout     Past Surgical History  Procedure Laterality Date  . No past surgeries      History   Social History  . Marital Status: Married    Spouse Name: N/A    Number of Children: 1  . Years of Education: N/A   Occupational History  . Administrator, Civil Servicesales consultant , [employer]    Social History Main Topics  . Smoking status: Never Smoker   . Smokeless tobacco: Never Used  . Alcohol Use: No  . Drug Use: No  . Sexual Activity: Not on file   Other Topics Concern  . Not on file   Social History Narrative   Lives w/ wife         Medication List       This list is accurate as of: 04/13/14  6:21 PM.  Always use your most recent med list.               amLODipine 10 MG tablet  Commonly known as:  NORVASC  Take 1 tablet (10 mg total) by mouth daily.     hyoscyamine 0.125 MG SL tablet  Commonly known as:  LEVSIN/SL  Place 1 tablet (0.125 mg total) under the tongue every 4 (four) hours as needed for cramping.     meloxicam 15 MG tablet  Commonly known as:  MOBIC           Objective:   Physical Exam BP 128/86  Pulse 110  Temp(Src)  97.8 F (36.6 C)  Wt 289 lb (131.09 kg)  SpO2 98%   General -- alert, well-developed, NAD.  Neck --  normal carotid pulse  HEENT-- Not pale.  Lungs -- normal respiratory effort, no intercostal retractions, no accessory muscle use, and normal breath sounds.  Heart-- normal rate, regular rhythm, no murmur.  Extremities-- no pretibial edema bilaterally  Neurologic--  alert & oriented X3. Speech normal, gait appropriate for age, strength symmetric and appropriate for age.  EOMI, PERLA   Psych-- Cognition and judgment appear intact. Cooperative with normal attention span and concentration. No anxious or depressed appearing.     Assessment & Plan:   Dizziness, Dizziness for one day In the setting of not feeling well the prior week w/diarrhea, symptoms resolved, etiology likely benign. Recommend observation for now.

## 2014-06-23 ENCOUNTER — Telehealth: Payer: Self-pay | Admitting: Internal Medicine

## 2014-06-23 ENCOUNTER — Ambulatory Visit (HOSPITAL_BASED_OUTPATIENT_CLINIC_OR_DEPARTMENT_OTHER)
Admission: RE | Admit: 2014-06-23 | Discharge: 2014-06-23 | Disposition: A | Discharge status: Home / Self Care | Payer: 59 | Source: Ambulatory Visit | Attending: Physician Assistant | Admitting: Physician Assistant

## 2014-06-23 ENCOUNTER — Ambulatory Visit (INDEPENDENT_AMBULATORY_CARE_PROVIDER_SITE_OTHER): Payer: 59 | Admitting: Physician Assistant

## 2014-06-23 ENCOUNTER — Encounter: Payer: Self-pay | Admitting: Physician Assistant

## 2014-06-23 VITALS — BP 142/92 | HR 124 | Temp 98.9°F | Resp 18 | Ht 70.0 in | Wt 286.2 lb

## 2014-06-23 DIAGNOSIS — M10072 Idiopathic gout, left ankle and foot: Secondary | ICD-10-CM

## 2014-06-23 DIAGNOSIS — S8990XA Unspecified injury of unspecified lower leg, initial encounter: Secondary | ICD-10-CM | POA: Diagnosis not present

## 2014-06-23 DIAGNOSIS — M109 Gout, unspecified: Secondary | ICD-10-CM

## 2014-06-23 DIAGNOSIS — S99919A Unspecified injury of unspecified ankle, initial encounter: Principal | ICD-10-CM

## 2014-06-23 DIAGNOSIS — S99929A Unspecified injury of unspecified foot, initial encounter: Principal | ICD-10-CM

## 2014-06-23 DIAGNOSIS — X58XXXA Exposure to other specified factors, initial encounter: Secondary | ICD-10-CM | POA: Diagnosis not present

## 2014-06-23 MED ORDER — COLCHICINE 0.6 MG PO TABS
ORAL_TABLET | ORAL | Status: DC
Start: 2014-06-23 — End: 2014-12-20

## 2014-06-23 MED ORDER — INDOMETHACIN 50 MG PO CAPS: 50.0000 mg | ORAL_CAPSULE | Freq: Three times a day (TID) | ORAL | Status: DC

## 2014-06-23 NOTE — Patient Instructions (Signed)
Please take colchicine and Indomethacin as directed with food.  Apply ice to your ankle.  Avoid weightbearing any more than necessary.  Keep extremity elevated.  Call if symptoms do not begin improving within 48 hours.  If symptoms acutely worsen or if fever develops, return to clinic or proceed to Urgent Care or ER if it is after hours as this is a sign of a more serious issue.  Gout Gout is when your joints become red, sore, and swell (inflamed). This is caused by the buildup of uric acid crystals in the joints. Uric acid is a chemical that is normally in the blood. If the level of uric acid gets too high in the blood, these crystals form in your joints and tissues. Over time, these crystals can form into masses near the joints and tissues. These masses can destroy bone and cause the bone to look misshapen (deformed). HOME CARE   Do not take aspirin for pain.  Only take medicine as told by your doctor.  Rest the joint as much as you can. When in bed, keep sheets and blankets off painful areas.  Keep the sore joints raised (elevated).  Put warm or cold packs on painful joints. Use of warm or cold packs depends on which works best for you.  Use crutches if the painful joint is in your leg.  Drink enough fluids to keep your pee (urine) clear or pale yellow. Limit alcohol, sugary drinks, and drinks with fructose in them.  Follow your diet instructions. Pay careful attention to how much protein you eat. Include fruits, vegetables, whole grains, and fat-free or low-fat milk products in your daily diet. Talk to your doctor or dietitian about the use of coffee, vitamin C, and cherries. These may help lower uric acid levels.  Keep a healthy body weight. GET HELP RIGHT AWAY IF:   You have watery poop (diarrhea), throw up (vomit), or have any side effects from medicines.  You do not feel better in 24 hours, or you are getting worse.  Your joint becomes suddenly more tender, and you have chills or  a fever. MAKE SURE YOU:   Understand these instructions.  Will watch your condition.  Will get help right away if you are not doing well or get worse. Document Released: 07/10/2008 Document Revised: 02/15/2014 Document Reviewed: 05/14/2012 [facility] Patient Information 2015 [place], [place]. This information is not intended to replace advice given to you by your health care provider. Make sure you discuss any questions you have with your health care provider.

## 2014-06-23 NOTE — Progress Notes (Signed)
Pre visit review using our clinic review tool, if applicable. No additional management support is needed unless otherwise documented below in the visit note/SLS  

## 2014-06-23 NOTE — Telephone Encounter (Signed)
Tell patient to take the Indomethacin given as directed, three times per day. This should be sufficient to calm things down until we get the colchicine.

## 2014-06-23 NOTE — Telephone Encounter (Signed)
Caller name: Leta Jungling Relation to VO:ZDGU Call back number:  [phone] Pharmacy: [facility]  Reason for call:  Pt states that the Rx colchicine 0.6 MG tablet needs a PA, and will take 3 to 4 days to approve.  He is needing something in it's place.  Can you please advise.

## 2014-06-23 NOTE — Telephone Encounter (Signed)
Informed patient of your advice.

## 2014-06-23 NOTE — Progress Notes (Signed)
Patient presents to clinic today c/o pain and swelling of his left ankle first noticed 3 days ago.  Patient denies trauma, twisting injury or fall.  Endorses warmth at site with some mild redness.  Symptom onset was gradual.  Denies fever, chills, myalgias.  Has a history of gout attack.  Denies alcohol consumption but does endorse recent increased consumption of red meats.   Past Medical History  Diagnosis Date  . HTN (hypertension)   . Gout     Current Outpatient Prescriptions on File Prior to Visit  Medication Sig Dispense Refill  . amLODipine (NORVASC) 10 MG tablet Take 1 tablet (10 mg total) by mouth daily.  30 tablet  6  . meloxicam (MOBIC) 15 MG tablet Take 15 mg by mouth daily.        No current facility-administered medications on file prior to visit.    Allergies  Allergen Reactions  . Bee Venom     Other reaction(s): ANAPHYLAXIS  . Peanut Oil     Other reaction(s): ANAPHYLAXIS  . Shellfish-Derived Products     Other reaction(s): ANAPHYLAXIS    Family History  Problem Relation Age of Onset  . CAD Mother     CABG in her 23s  . Stroke Mother   . Diabetes Mother   . Colon cancer Neg Hx   . Prostate cancer Father     dx in his 7s    History   Social History  . Marital Status: Married    Spouse Name: N/A    Number of Children: 1  . Years of Education: N/A   Occupational History  . Administrator, Civil Service , [employer]    Social History Main Topics  . Smoking status: Never Smoker   . Smokeless tobacco: Never Used  . Alcohol Use: No  . Drug Use: No  . Sexual Activity: None   Other Topics Concern  . None   Social History Narrative   Lives w/ wife    Review of Systems - See HPI.  All other ROS are negative.  BP 142/92  Pulse 124  Temp(Src) 98.9 F (37.2 C) (Oral)  Resp 18  Ht  (1.778 m)  Wt 286 lb 4 oz (129.842 kg)  BMI 41.07 kg/m2  SpO2 97%  Physical Exam  Vitals reviewed. Constitutional: He is oriented to person, place, and time and  well-developed, well-nourished, and in no distress.  HENT:  Head: Normocephalic and atraumatic.  Eyes: Conjunctivae are normal.  Cardiovascular: Normal rate, regular rhythm, normal heart sounds and intact distal pulses.   Pulmonary/Chest: Effort normal and breath sounds normal. No respiratory distress. He has no wheezes. He has no rales. He exhibits no tenderness.  Musculoskeletal:       Left ankle: He exhibits swelling. He exhibits normal range of motion and normal pulse.       Feet:  Neurological: He is alert and oriented to person, place, and time.  Skin: Skin is warm and dry.    No results found for this or any previous visit (from the past 2160 hour(s)).  Assessment/Plan: Gout Will obtain x-ray to further assess joint.  Suspect gout attack.  Rx Colcicine x 3.  Rx Indomethacin to take daily over the next week.  Avoid NSAIDs, alcohol and red meats/shellfish.  Return to clinic if symptoms are not improving over the next couple of days.

## 2014-06-27 NOTE — Assessment & Plan Note (Signed)
Will obtain x-ray to further assess joint.  Suspect gout attack.  Rx Colcicine x 3.  Rx Indomethacin to take daily over the next week.  Avoid NSAIDs, alcohol and red meats/shellfish.  Return to clinic if symptoms are not improving over the next couple of days.

## 2014-07-02 ENCOUNTER — Ambulatory Visit: Payer: 59 | Admitting: Internal Medicine

## 2014-09-30 ENCOUNTER — Telehealth: Payer: Self-pay | Admitting: *Deleted

## 2014-09-30 NOTE — Telephone Encounter (Signed)
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 Form filled out as much as possible and forwarded to Dr. Drue NovelPaz. JG//CMA

## 2014-10-05 ENCOUNTER — Ambulatory Visit: Payer: 59

## 2014-10-11 ENCOUNTER — Telehealth: Payer: Self-pay | Admitting: Internal Medicine

## 2014-10-11 ENCOUNTER — Encounter: Payer: Self-pay | Admitting: *Deleted

## 2014-10-11 DIAGNOSIS — Z7689 Persons encountering health services in other specified circumstances: Secondary | ICD-10-CM

## 2014-10-11 NOTE — Telephone Encounter (Signed)
Caller name: Alfred LevinsJefferson,Marcia Relation to pt: spouse  Call back number: [phone]  Reason for call:  Patient dropped off medical history form needed for [employer] of Social Services. Form filled out as much as possible and forwarded to Dr. Drue NovelPaz. Spouse is checking the status of paperwork and would like to pick up today. Please advise

## 2014-10-11 NOTE — Telephone Encounter (Signed)
My part of the form has been ready, the other part is for the patient to complete, papers ready for pick up

## 2014-10-11 NOTE — Telephone Encounter (Signed)
Pt given Social Services paperwork completed by [employer]. Pt also given paperwork he dropped off that he needed to complete himself. Copy of Social Services paperwork [facility] put back on [place] Glover's desk attached to [employer]illing sheet.  bw

## 2014-10-11 NOTE — Telephone Encounter (Signed)
Copies made and sent to scanning, Pt given original copies.

## 2014-10-11 NOTE — Telephone Encounter (Signed)
Error

## 2014-10-11 NOTE — Telephone Encounter (Signed)
Have you seen this? Please advise. 

## 2014-12-13 ENCOUNTER — Telehealth: Payer: Self-pay | Admitting: Internal Medicine

## 2014-12-13 MED ORDER — PREDNISONE 10 MG PO TABS: ORAL_TABLET | ORAL | Status: DC

## 2014-12-13 NOTE — Telephone Encounter (Signed)
LMOM informing Pt that prednisone has been sent to pharmacy, that he can also use Indocin which he should already have, if not we can send refills to pharmacy.

## 2014-12-13 NOTE — Telephone Encounter (Signed)
Caller name: Alfred LevinsJefferson,Marcia Relation to pt: spouse  Call back number: [phone]  [facility] [address] [place], [place] [id number] (New Pharmacy)   Reason for call:  Prednisone is to expensive $400  and insurance is not covering, requesting another RX

## 2014-12-13 NOTE — Telephone Encounter (Signed)
Pt's wife is calling wanting to know if dr. Drue NovelPaz will call pt in something since there are no available appointments today. Please call pt and confirm. Pt is in a lot of pain

## 2014-12-13 NOTE — Telephone Encounter (Signed)
please clarify:  does he think he has a gout episode? If he is not sure, base on the joint pain and fever --- he needs to be seen.

## 2014-12-13 NOTE — Telephone Encounter (Signed)
Caller name: Alfred LevinsMarcia Belvedere Relationship to patient:wife Can be [phone] [facility] [address] [place]  Reason for call: PT thought he sprained left ankle over the weekend-  fever approx 102.1 yesterday - no fever this morning. Unable to come into office due to transportation issues, requesting RX be called in for flare up.

## 2014-12-13 NOTE — Telephone Encounter (Signed)
I'm sending a prednisone prescription, also take   indocin  as needed. Call if not improving

## 2014-12-13 NOTE — Telephone Encounter (Signed)
Pts wife is now calling in regarding medication. States Pt is in a lot of pain and would like something called in ASAP.

## 2014-12-13 NOTE — Telephone Encounter (Signed)
Please advise 

## 2014-12-13 NOTE — Telephone Encounter (Signed)
Spoke with Pt, he does believe it is a gout flare up. Informed him that he may need to be seen since he has been having the joint pain and fever, Pt expressed concern that he is not able to get to the office due to transportation issues. Informed him I would let Dr. Drue NovelPaz know. Pt verbalized understanding.

## 2014-12-14 NOTE — Telephone Encounter (Signed)
Per our records, I see where Pt must have picked up Prednisone as their is no fill or refills remaining.

## 2014-12-16 ENCOUNTER — Ambulatory Visit (INDEPENDENT_AMBULATORY_CARE_PROVIDER_SITE_OTHER): Payer: 59 | Admitting: Medical

## 2014-12-16 ENCOUNTER — Ambulatory Visit (HOSPITAL_BASED_OUTPATIENT_CLINIC_OR_DEPARTMENT_OTHER)
Admission: RE | Admit: 2014-12-16 | Discharge: 2014-12-16 | Disposition: A | Discharge status: Home / Self Care | Payer: 59 | Source: Ambulatory Visit | Attending: Medical | Admitting: Medical

## 2014-12-16 ENCOUNTER — Encounter: Payer: Self-pay | Admitting: Medical

## 2014-12-16 VITALS — BP 147/88 | HR 115 | Temp 97.6°F | Ht 70.0 in | Wt 282.6 lb

## 2014-12-16 DIAGNOSIS — M25572 Pain in left ankle and joints of left foot: Secondary | ICD-10-CM | POA: Insufficient documentation

## 2014-12-16 DIAGNOSIS — M25579 Pain in unspecified ankle and joints of unspecified foot: Secondary | ICD-10-CM | POA: Insufficient documentation

## 2014-12-16 MED ORDER — ALLOPURINOL 100 MG PO TABS: 100.0000 mg | ORAL_TABLET | Freq: Every day | ORAL | Status: DC

## 2014-12-16 MED ORDER — KETOROLAC TROMETHAMINE 60 MG/2ML IM SOLN
60.0000 mg | Freq: Once | INTRAMUSCULAR | Status: AC
  Administered 2014-12-16: 60 mg via INTRAMUSCULAR

## 2014-12-16 NOTE — Assessment & Plan Note (Signed)
Probable gout. But with recent fever reported on Monday will get cbc as well as uric acid. If any fever occurs again notify us.  Please get xray today.  Try to use colchicine coupon. If reasonable priced can hold last 2 days of tapered prednisone.  toradol im today.  Allopurinol 100 mg tabs rx.  Follow up in 7 days or as needed. If pain resolving quickly and better then don't need 7 day followw up.  Then return in one month.

## 2014-12-16 NOTE — Telephone Encounter (Signed)
1. There is no "alternative" for colchicine. 2. We can send a prescription for prednisone to another pharmacy, in a previous phone call he said it costs   $400 (!!). It shouldn't be that expensive 3. He can take Indocin with GI precautions. 4. For any further advice, needs office visit

## 2014-12-16 NOTE — Progress Notes (Signed)
Subjective:    Patient ID: Ian Wilson, male    DOB: [birth year], 54 y.o.   MRN: [id number]  HPI   Pt in with left ankle pain. Pain since last Thursday. Pain pretty severe. He states at one point t max of 102. On and off fever until Monday.Pt states colchicine was too expensive. And they have coupon up front that will make it reasonable.(hopefully per pt.)  Pt had prednisone called in and he states started on monday. Pt states by Monday level 5. Tuesday and wed level 3. Pt tried to work today pain increased again to level 5.   Pt is on about 4 days of 8 days taper prednisone.     Review of Systems  Constitutional: Negative for fever, chills, diaphoresis, activity change and fatigue.  Respiratory: Negative for cough, chest tightness and shortness of breath.   Cardiovascular: Negative for chest pain and palpitations.  Gastrointestinal: Positive for vomiting.  Musculoskeletal:       Lt ankle pain. Medial aspect.  Neurological: Negative for dizziness, weakness, numbness and headaches.  Psychiatric/Behavioral: Negative for behavioral problems, confusion and agitation.   Past Medical History  Diagnosis Date  . HTN (hypertension)   . Gout     History   Social History  . Marital Status: Married    Spouse Name: N/A  . Number of Children: 1  . Years of Education: N/A   Occupational History  . Administrator, Civil Service , [employer]    Social History Main Topics  . Smoking status: Never Smoker   . Smokeless tobacco: Never Used  . Alcohol Use: No  . Drug Use: No  . Sexual Activity: Not on file   Other Topics Concern  . Not on file   Social History Narrative   Lives w/ wife     Past Surgical History  Procedure Laterality Date  . No past surgeries      Family History  Problem Relation Age of Onset  . CAD Mother     CABG in her 27s  . Stroke Mother   . Diabetes Mother   . Colon cancer Neg Hx   . Prostate cancer Father     dx in his 29s    Allergies  Allergen  Reactions  . Bee Venom     Other reaction(s): ANAPHYLAXIS  . Peanut Oil     Other reaction(s): ANAPHYLAXIS  . Shellfish-Derived Products     Other reaction(s): ANAPHYLAXIS    Current Outpatient Prescriptions on File Prior to Visit  Medication Sig Dispense Refill  . amLODipine (NORVASC) 10 MG tablet Take 1 tablet (10 mg total) by mouth daily. 30 tablet 6  . indomethacin (INDOCIN) 50 MG capsule Take 1 capsule (50 mg total) by mouth 3 (three) times daily with meals. 21 capsule 0  . meloxicam (MOBIC) 15 MG tablet Take 15 mg by mouth daily.     . predniSONE (DELTASONE) 10 MG tablet 4 tablets x 2 days, 3 tabs x 2 days, 2 tabs x 2 days, 1 tab x 2 days 20 tablet 0  . colchicine 0.6 MG tablet Take 2 tablets by mouth.  Then take 1 tablet 2 hours later. (Patient not taking: Reported on 12/16/2014) 60 tablet 2   No current facility-administered medications on file prior to visit.    BP 147/88 mmHg  Pulse 115  Temp(Src) 97.6 F (36.4 C) (Oral)  Ht  (1.778 m)  Wt 282 lb 9.6 oz (128.187 kg)  BMI 40.55 kg/m2  SpO2 98%      Objective:   Physical Exam   General- no acute distress.  Lt lower ext- no lymhadenopathy in calf area. No swelling of calf. Lt ankle- medial asepct swollen  faint warm and tender. No redness.        Assessment & Plan:

## 2014-12-16 NOTE — Telephone Encounter (Signed)
Please advise 

## 2014-12-16 NOTE — Telephone Encounter (Addendum)
Caller name: Ian LevinsJefferson,Ian Wilson Relation to pt: spouse  Call back number: [phone] Pharmacy: [facility] [address], [place], [place] [address] [phone]    Reason for call:  Pt states colchicine 0.6 MG tablet is to expensive requesting another RX. (pt wanted to retract and state even thou prednisone was expensive pt picked up) and requesting a cheaper rx for colchicine.

## 2014-12-16 NOTE — Progress Notes (Signed)
Pre visit review using our clinic review tool, if applicable. No additional management support is needed unless otherwise documented below in the visit note. 

## 2014-12-16 NOTE — Patient Instructions (Signed)
Ankle pain Probable gout. But with recent fever reported on Monday will get cbc as well as uric acid. If any fever occurs again notify us.  Please get xray today.  Try to use colchicine coupon. If reasonable priced can hold last 2 days of tapered prednisone.  toradol im today.  Allopurinol 100 mg tabs rx.  Follow up in 7 days or as needed. If pain resolving quickly and better then don't need 7 day followw up.  Then return in one month.

## 2014-12-16 NOTE — Telephone Encounter (Signed)
Spoke with Leta JunglingMarcia, Pts wife, informed her unfortunately there are no alternatives to Colcrys but that we do have discount cards that should either lower the cost or drop the cost to $15 dollars. Informed her that I could mail it or place it at the front desk, Pt requested I place the discount coupon at the front desk. Informed her that I would.

## 2014-12-17 ENCOUNTER — Other Ambulatory Visit: Payer: 59

## 2014-12-17 LAB — CBC WITH DIFFERENTIAL/PLATELET
BASOS ABS: 0 10*3/uL (ref 0.0–0.1)
Basophils Relative: 0.4 % (ref 0.0–3.0)
Eosinophils Absolute: 0.1 10*3/uL (ref 0.0–0.7)
Eosinophils Relative: 1.9 % (ref 0.0–5.0)
HCT: 44.2 % (ref 39.0–52.0)
Hemoglobin: 14.7 g/dL (ref 13.0–17.0)
Lymphocytes Relative: 30.6 % (ref 12.0–46.0)
Lymphs Abs: 2.4 10*3/uL (ref 0.7–4.0)
MCHC: 33.2 g/dL (ref 30.0–36.0)
MCV: 89.9 fl (ref 78.0–100.0)
MONO ABS: 0.5 10*3/uL (ref 0.1–1.0)
MONOS PCT: 7.1 % (ref 3.0–12.0)
NEUTROS PCT: 60 % (ref 43.0–77.0)
Neutro Abs: 4.6 10*3/uL (ref 1.4–7.7)
PLATELETS: 247 10*3/uL (ref 150.0–400.0)
RBC: 4.91 Mil/uL (ref 4.22–5.81)
RDW: 13.9 % (ref 11.5–15.5)
WBC: 7.7 10*3/uL (ref 4.0–10.5)

## 2014-12-17 LAB — COMPREHENSIVE METABOLIC PANEL
ALK PHOS: 60 U/L (ref 39–117)
ALT: 33 U/L (ref 0–53)
AST: 17 U/L (ref 0–37)
Albumin: 3.7 g/dL (ref 3.5–5.2)
BILIRUBIN TOTAL: 0.4 mg/dL (ref 0.2–1.2)
BUN: 23 mg/dL (ref 6–23)
CO2: 30 mEq/L (ref 19–32)
Calcium: 8.6 mg/dL (ref 8.4–10.5)
Chloride: 106 mEq/L (ref 96–112)
Creatinine, Ser: 1.25 mg/dL (ref 0.40–1.50)
GFR: 77.86 mL/min (ref >60.00)
Glucose, Bld: 89 mg/dL (ref 70–99)
Potassium: 3.8 mEq/L (ref 3.5–5.1)
SODIUM: 140 meq/L (ref 135–145)
TOTAL PROTEIN: 7.1 g/dL (ref 6.0–8.3)

## 2014-12-17 LAB — URIC ACID: Uric Acid, Serum: 8.2 mg/dL — ABNORMAL HIGH (ref 4.0–7.8)

## 2014-12-17 NOTE — Addendum Note (Signed)
Addended by: Verdie ShireBAYNES, Mishael Krysiak M on: 12/17/2014 10:50 AM   Modules accepted: Orders

## 2014-12-20 ENCOUNTER — Encounter: Payer: Self-pay | Admitting: Internal Medicine

## 2014-12-20 ENCOUNTER — Ambulatory Visit (INDEPENDENT_AMBULATORY_CARE_PROVIDER_SITE_OTHER): Payer: 59 | Admitting: Internal Medicine

## 2014-12-20 VITALS — BP 132/78 | HR 90 | Temp 98.1°F | Ht 70.0 in | Wt 276.5 lb

## 2014-12-20 DIAGNOSIS — M159 Polyosteoarthritis, unspecified: Secondary | ICD-10-CM

## 2014-12-20 DIAGNOSIS — M10072 Idiopathic gout, left ankle and foot: Secondary | ICD-10-CM

## 2014-12-20 DIAGNOSIS — M150 Primary generalized (osteo)arthritis: Secondary | ICD-10-CM

## 2014-12-20 MED ORDER — INDOMETHACIN 50 MG PO CAPS: 50.0000 mg | ORAL_CAPSULE | Freq: Three times a day (TID) | ORAL | Status: DC

## 2014-12-20 MED ORDER — PREDNISONE 10 MG PO TABS: ORAL_TABLET | ORAL | Status: DC

## 2014-12-20 NOTE — Progress Notes (Signed)
   Subjective:    Patient ID: Ian Wilson, male    DOB: 1961-03-27, 54 y.o.   MRN: [id number]  DOS:  12/20/2014 Type of visit - description : acute Interval history: Gout symptoms started approximately 10 days ago with left ankle pain and swelling. He had a hard time getting colchicine due to cost, eventually was prescribed prednisone and the symptoms started to improve. He was seen here 5 days ago, x-ray and CBC normal, uric acid 8.2. He was a started allopurinol. Since then, the pain has decrease, the swelling is still there.  Also, many years history of bilateral knee pain, worse on the left  Review of Systems No further fever or chills No nausea, vomiting, diarrhea Denies calf pain or swelling  Past Medical History  Diagnosis Date  . HTN (hypertension)   . Gout     Past Surgical History  Procedure Laterality Date  . No past surgeries      History   Social History  . Marital Status: Married    Spouse Name: N/A  . Number of Children: 1  . Years of Education: N/A   Occupational History  . Administrator, Civil Servicesales consultant , [employer]    Social History Main Topics  . Smoking status: Never Smoker   . Smokeless tobacco: Never Used  . Alcohol Use: No  . Drug Use: No  . Sexual Activity: Not on file   Other Topics Concern  . Not on file   Social History Narrative   Lives w/ wife         Medication List       This list is accurate as of: 12/20/14 11:59 PM.  Always use your most recent med list.               allopurinol 100 MG tablet  Commonly known as:  ZYLOPRIM  Take 1 tablet (100 mg total) by mouth daily.     amLODipine 10 MG tablet  Commonly known as:  NORVASC  Take 1 tablet (10 mg total) by mouth daily.     indomethacin 50 MG capsule  Commonly known as:  INDOCIN  Take 1 capsule (50 mg total) by mouth 3 (three) times daily with meals.     predniSONE 10 MG tablet  Commonly known as:  DELTASONE  4 tablets x 3 days, 3 tabs x 3 days, 2 tabs x 3 days, 1 tab  x 3 days           Objective:   Physical Exam BP 132/78 mmHg  Pulse 90  Temp(Src) 98.1 F (36.7 C) (Oral)  Ht 5\' 10"  (1.778 m)  Wt 276 lb 8 oz (125.42 kg)  BMI 39.67 kg/m2  SpO2 98%  General:   Well developed, well nourished . NAD.  HEENT:  Normocephalic . Face symmetric, atraumatic  Heart: RRR,  no murmur.  Muscle skeletal: Right leg: Knee with deformities consistent with DJD, no effusion. Ankle normal Left leg: Knee with deformities consistent with DJD, no effusion. Ankle slightly swollen, warm, mild TTP, range of motion normal, skin without redness. Good pedal pulses bilaterally Claves measured and symmetric Neurologic:  alert & oriented X3.  Speech normal  Psych--  Cognition and judgment appear intact.  Cooperative with normal attention span and concentration.  Behavior appropriate. No anxious or depressed appearing.      Assessment & Plan:

## 2014-12-20 NOTE — Assessment & Plan Note (Addendum)
10 days history of left ankle pain and swelling, likely gout. These episodes has been somehow protracted, has been unable to get colchicine, started allopurinol few days ago which may prolong the acute symptoms. Plan: Continue allopurinol ice, leg elevation Prednisone round #2 Indocin, GI precautions discussed Diet discussed Come back in 3-4 weeks for reassessment and a physical exam.  Today , I spent more than 25   min with the patient: >50% of the time counseling regards  Gout diet, nature of DJD and need to see orthopedic surgery

## 2014-12-20 NOTE — Assessment & Plan Note (Signed)
Knee pain, likely DJD. Recommend to see ortho, will refer him

## 2014-12-20 NOTE — Patient Instructions (Signed)
Continue allopurinol  Restart prednisone  Indocin 3 times a day as needed for pain, take it with food to prevent ulcers and gastritis.  Follow a gout diet  Leg elevation, ice  Call anytime if you're not improving, you have fever, chills or the swelling spreads.  Please schedule a physical in about 3 weeks

## 2014-12-20 NOTE — Progress Notes (Signed)
Pre visit review using our clinic review tool, if applicable. No additional management support is needed unless otherwise documented below in the visit note. 

## 2014-12-24 ENCOUNTER — Telehealth: Payer: Self-pay | Admitting: *Deleted

## 2014-12-24 NOTE — Telephone Encounter (Signed)
Pt dropped off return to work form needed by [employer]. Pt states he would like to return to work on 12/28/2014 with no restrictions. Pt states the medication given is doing well for Gout. He states his pain level is at a one and he is walking a little better. Form forwarded to Dr. Drue NovelPaz. JG//CMA

## 2014-12-27 NOTE — Telephone Encounter (Signed)
Note from the patient reviewed. Unable to work since 12/10/2014 and requests to go back to work 12/28/2014 with no restriction. That is okay. Form completed

## 2014-12-27 NOTE — Telephone Encounter (Signed)
FYI

## 2014-12-27 NOTE — Telephone Encounter (Addendum)
Caller name: Sherene SiresJefferson, Rasul D Relation to pt: self  Call back number: [phone]   Reason for call:  Pt inquiring about doctor notes

## 2014-12-28 NOTE — Telephone Encounter (Signed)
Called and left message informing pt that forms are ready for pick up at our front desk.  Copy sent for scanning. JG//CMA

## 2015-01-04 NOTE — Telephone Encounter (Signed)
Pt returned to office today (01/04/2015) requesting date be changed to 12/30/2014, the actual date Pt returned to work. Okay by Dr. Drue NovelPaz. Dates corrected, copies made, original copy given back to Pt and copy sent for scanning into chart.

## 2015-01-12 ENCOUNTER — Encounter: Payer: Self-pay | Admitting: Internal Medicine

## 2015-01-12 ENCOUNTER — Ambulatory Visit (INDEPENDENT_AMBULATORY_CARE_PROVIDER_SITE_OTHER): Payer: 59 | Admitting: Internal Medicine

## 2015-01-12 VITALS — BP 122/64 | HR 83 | Temp 98.2°F | Ht 70.0 in | Wt 279.0 lb

## 2015-01-12 DIAGNOSIS — Z0000 Encounter for general adult medical examination without abnormal findings: Secondary | ICD-10-CM

## 2015-01-12 DIAGNOSIS — I10 Essential (primary) hypertension: Secondary | ICD-10-CM

## 2015-01-12 DIAGNOSIS — M159 Polyosteoarthritis, unspecified: Secondary | ICD-10-CM

## 2015-01-12 DIAGNOSIS — M10072 Idiopathic gout, left ankle and foot: Secondary | ICD-10-CM

## 2015-01-12 DIAGNOSIS — M150 Primary generalized (osteo)arthritis: Secondary | ICD-10-CM

## 2015-01-12 DIAGNOSIS — M1000 Idiopathic gout, unspecified site: Secondary | ICD-10-CM | POA: Diagnosis not present

## 2015-01-12 LAB — BASIC METABOLIC PANEL
BUN: 11 mg/dL (ref 6–23)
CO2: 29 meq/L (ref 19–32)
Calcium: 9.1 mg/dL (ref 8.4–10.5)
Chloride: 105 mEq/L (ref 96–112)
Creatinine, Ser: 1.05 mg/dL (ref 0.40–1.50)
GFR: 95.19 mL/min (ref >60.00)
Glucose, Bld: 94 mg/dL (ref 70–99)
Potassium: 4 mEq/L (ref 3.5–5.1)
Sodium: 138 mEq/L (ref 135–145)

## 2015-01-12 LAB — PSA: PSA: 3.44 ng/mL (ref 0.10–4.00)

## 2015-01-12 LAB — LIPID PANEL
Cholesterol: 212 mg/dL — ABNORMAL HIGH (ref 0–200)
HDL: 42.5 mg/dL (ref >39.00)
LDL CALC: 156 mg/dL — AB (ref 0–99)
NONHDL: 169.5
Total CHOL/HDL Ratio: 5
Triglycerides: 70 mg/dL (ref 0.0–149.0)
VLDL: 14 mg/dL (ref 0.0–40.0)

## 2015-01-12 LAB — URIC ACID: URIC ACID, SERUM: 6.6 mg/dL (ref 4.0–7.8)

## 2015-01-12 LAB — ALT: ALT: 15 U/L (ref 0–53)

## 2015-01-12 LAB — AST: AST: 12 U/L (ref 0–37)

## 2015-01-12 MED ORDER — INDOMETHACIN 50 MG PO CAPS: 50.0000 mg | ORAL_CAPSULE | Freq: Three times a day (TID) | ORAL | Status: DC

## 2015-01-12 MED ORDER — ALLOPURINOL 100 MG PO TABS: 100.0000 mg | ORAL_TABLET | Freq: Every day | ORAL | Status: DC

## 2015-01-12 NOTE — Progress Notes (Signed)
Subjective:    Patient ID: Ian SiresGeorge D Wilson, male    DOB: 1961/09/25, 54 y.o.   MRN: [id number]  DOS:  01/12/2015 Type of visit - description : cpx Interval history: In general feeling well, was seen recently with gout, symptoms resolved.   Review of Systems  Constitutional: No fever, chills. No unexplained wt changes. No unusual sweats HEENT: No dental problems, ear discharge, facial swelling, voice changes. No eye discharge, redness or intolerance to light Respiratory: No wheezing or difficulty breathing. No cough , mucus production Cardiovascular: No CP, leg swelling or palpitations GI: no nausea, vomiting, diarrhea or abdominal pain.  No blood in the stools. No dysphagia   Endocrine: No polyphagia, polyuria or polydipsia GU: No dysuria, gross hematuria, difficulty urinating. No urinary urgency or frequency. Musculoskeletal:  Was seen with gout, symptoms eventually resolved. Has chronic left knee problems.  Skin: No change in the color of the skin, palor or rash Allergic, immunologic: No environmental allergies, +  food allergies , has a EpiPen Neurological: No dizziness or syncope. No headaches. No diplopia, slurred speech, motor deficits, facial numbness Hematological: No enlarged lymph nodes, easy bruising or bleeding Psychiatry: No suicidal ideas, hallucinations, behavior problems or confusion. No unusual/severe anxiety or depression.    Past Medical History  Diagnosis Date  . HTN (hypertension)   . Gout     Past Surgical History  Procedure Laterality Date  . No past surgeries      History   Social History  . Marital Status: Married    Spouse Name: N/A  . Number of Children: 1  . Years of Education: N/A   Occupational History  . Administrator, Civil Servicesales consultant , [employer]    Social History Main Topics  . Smoking status: Never Smoker   . Smokeless tobacco: Never Used  . Alcohol Use: No  . Drug Use: No  . Sexual Activity: Not on file   Other Topics Concern  . Not  on file   Social History Narrative   Lives w/ wife , daughter , 53 y/o     Family History  Problem Relation Age of Onset  . CAD Mother     CABG in her 2150s  . Stroke Mother   . Diabetes Mother   . Colon cancer Neg Hx   . Prostate cancer Father     dx in his 6970s      Medication List       This list is accurate as of: 01/12/15  5:41 PM.  Always use your most recent med list.               allopurinol 100 MG tablet  Commonly known as:  ZYLOPRIM  Take 1 tablet (100 mg total) by mouth daily.     EPINEPHrine 0.15 MG/0.3ML injection  Commonly known as:  EPIPEN JR  Inject 0.15 mg into the muscle as needed for anaphylaxis.     indomethacin 50 MG capsule  Commonly known as:  INDOCIN  Take 1 capsule (50 mg total) by mouth 3 (three) times daily with meals.           Objective:   Physical Exam BP 122/64 mmHg  Pulse 83  Temp(Src) 98.2 F (36.8 C) (Oral)  Ht 5\' 10"  (1.778 m)  Wt 279 lb (126.554 kg)  BMI 40.03 kg/m2  SpO2 98% General:   Well developed, well nourished . NAD.  Neck:  Full range of motion. Supple. No  thyromegaly , normal carotid pulse HEENT:  Normocephalic . Face symmetric, atraumatic Lungs:  CTA B Normal respiratory effort, no intercostal retractions, no accessory muscle use. Heart: RRR,  no murmur.  Abdomen:  Not distended, soft, non-tender. No rebound or rigidity. No mass,organomegaly Muscle skeletal: no pretibial edema bilaterally  Rectal:  External abnormalities: none. Normal sphincter tone. No rectal masses or tenderness.  Stool brown  Prostate: Prostate gland firm and smooth, no enlargement, nodularity, tenderness, mass, asymmetry or induration.  Skin: Exposed areas without rash. Not pale. Not jaundice Neurologic:  alert & oriented X3.  Speech normal, gait appropriate for age and unassisted Strength symmetric and appropriate for age.  Psych: Cognition and judgment appear intact.  Cooperative with normal attention span and  concentration.  Behavior appropriate. No anxious or depressed appearing.       Assessment & Plan:

## 2015-01-12 NOTE — Assessment & Plan Note (Addendum)
Tdap--2009   Had a cscope [facility]([place]),   ~ 2012, pt was told normal , no report available   + FH heart disease mother at age 650 + FH father prostate cancer at age 53. Prostate cancer screening, DRE negative today, check a PSA  Doing better with diet and plans to exercise more, praised Labs.

## 2015-01-12 NOTE — Patient Instructions (Signed)
Get your blood work before you leave    Come back to the office in 6 months   for a routine check up   

## 2015-01-12 NOTE — Assessment & Plan Note (Addendum)
Also d/c amlodipine for several months, doing better with diet, plans to exercise more, ambulatory BPs are excellent in the 120s. Plan: We'll do a trial w/o medication, continue monitoring BPs, restarted amlodipine if needed

## 2015-01-12 NOTE — Assessment & Plan Note (Signed)
Symptoms eventually subsided, now on allopurinol, will do Indocin if needed. Check labs due to recent initiation of allopurinol

## 2015-01-12 NOTE — Assessment & Plan Note (Signed)
Has chronic problems with the left knee, has noted some bowing, plans to see a orthopedic surgeon at some point

## 2015-01-12 NOTE — Progress Notes (Signed)
Pre visit review using our clinic review tool, if applicable. No additional management support is needed unless otherwise documented below in the visit note. 

## 2015-01-19 ENCOUNTER — Other Ambulatory Visit: Payer: Self-pay | Admitting: Internal Medicine

## 2015-01-20 ENCOUNTER — Telehealth: Payer: Self-pay | Admitting: Internal Medicine

## 2015-01-20 NOTE — Telephone Encounter (Signed)
Caller name:Marley, marcia Relation to ZO:XWRUEApt:spouse Call back number:[phone] [facility]acy[facility]  Reason for call: pt is wanted to know if he can get rx predinosone, states his gout has flared back up again. Wants to know if there is anyone who can approve the rx until he can see dr. Drue NovelPaz next week.

## 2015-01-20 NOTE — Telephone Encounter (Signed)
Please advise 

## 2015-01-20 NOTE — Telephone Encounter (Signed)
Looks like he just had prednisone--- if so -- he would need an appointment

## 2015-01-21 ENCOUNTER — Ambulatory Visit: Payer: 59 | Admitting: Internal Medicine

## 2015-01-21 NOTE — Telephone Encounter (Signed)
He just had prednisone-- not good to take it again so soon--- am I wrong?  Didn't /dr paz just give him some?

## 2015-01-21 NOTE — Telephone Encounter (Signed)
Pt was here 01/12/2015 for his CPE. Will he still need to come in for a visit?

## 2015-01-21 NOTE — Telephone Encounter (Signed)
Offered patient appointment to be seen sooner, patient declined stating that he will wait to see Dr. Drue NovelPaz

## 2015-01-21 NOTE — Telephone Encounter (Signed)
Pt was given Prednisone on 12/20/2014 for gout flare, will let Pt know he needs appt for eval.

## 2015-01-21 NOTE — Telephone Encounter (Signed)
Per Dr. Laury AxonLowne, in Dr. Leta JunglingPaz's absence, Pt will need an appt for more Prednisone, just given 12/20/2014.

## 2015-01-27 ENCOUNTER — Encounter: Payer: Self-pay | Admitting: Internal Medicine

## 2015-01-27 ENCOUNTER — Ambulatory Visit (INDEPENDENT_AMBULATORY_CARE_PROVIDER_SITE_OTHER): Payer: 59 | Admitting: Internal Medicine

## 2015-01-27 VITALS — BP 128/78 | HR 92 | Temp 98.0°F | Ht 70.0 in | Wt 280.0 lb

## 2015-01-27 DIAGNOSIS — M1000 Idiopathic gout, unspecified site: Secondary | ICD-10-CM

## 2015-01-27 MED ORDER — PREDNISONE 10 MG PO TABS: ORAL_TABLET | ORAL | Status: DC

## 2015-01-27 NOTE — Progress Notes (Signed)
Pre visit review using our clinic review tool, if applicable. No additional management support is needed unless otherwise documented below in the visit note. 

## 2015-01-27 NOTE — Progress Notes (Signed)
   Subjective:    Patient ID: Ian Wilson, male    DOB: 16-Aug-1961, 54 y.o.   MRN: [id number]  DOS:  01/27/2015 Type of visit - description : acute Interval history: Few days ago he ran out of allopurinol for 2 days, shortly after started to have a discomfort at the left great toe and call the office for possible prednisone prescription which was not provided. Symptoms quickly subsided, he is back to normal. Would like to have a prescription for prednisone "just in case".   Review of Systems   Past Medical History  Diagnosis Date  . HTN (hypertension)   . Gout     Past Surgical History  Procedure Laterality Date  . No past surgeries      History   Social History  . Marital Status: Married    Spouse Name: N/A  . Number of Children: 1  . Years of Education: N/A   Occupational History  . Administrator, Civil Servicesales consultant , [employer]    Social History Main Topics  . Smoking status: Never Smoker   . Smokeless tobacco: Never Used  . Alcohol Use: No  . Drug Use: No  . Sexual Activity: Not on file   Other Topics Concern  . Not on file   Social History Narrative   Lives w/ wife , daughter , 53 y/o        Medication List       This list is accurate as of: 01/27/15 11:59 PM.  Always use your most recent med list.               allopurinol 100 MG tablet  Commonly known as:  ZYLOPRIM  Take 1 tablet (100 mg total) by mouth daily.     EPINEPHrine 0.15 MG/0.3ML injection  Commonly known as:  EPIPEN JR  Inject 0.15 mg into the muscle as needed for anaphylaxis.     indomethacin 50 MG capsule  Commonly known as:  INDOCIN  Take 1 capsule (50 mg total) by mouth 3 (three) times daily with meals.     predniSONE 10 MG tablet  Commonly known as:  DELTASONE  4 tablets x 3 days, 3 tabs x 3 days, 2 tabs x 3 days, 1 tab x 3 days           Objective:   Physical Exam BP 128/78 mmHg  Pulse 92  Temp(Src) 98 F (36.7 C) (Oral)  Ht 5\' 10"  (1.778 m)  Wt 280 lb (127.007 kg)   BMI 40.18 kg/m2  SpO2 97%  General:   Well developed, well nourished . NAD.  HEENT:  Normocephalic . Face symmetric, atraumatic  Psych--  Cognition and judgment appear intact.  Cooperative with normal attention span and concentration.  Behavior appropriate. No anxious or depressed appearing.       Assessment & Plan:

## 2015-01-27 NOTE — Patient Instructions (Signed)
Okay to take prednisone only if you are sure you have a gout episode.  If you have more episodes of gout please call me and let me know  Next visit by September 2016

## 2015-01-27 NOTE — Assessment & Plan Note (Addendum)
Had a mild podagra  after he ran out of allopurinol for 2 days, he is back to normal but request a prescription for prednisone in case he has another episode. I agreed to prescribe the medication but asked to only take it if he is sure what he has  a gout episode.

## 2015-07-15 ENCOUNTER — Ambulatory Visit: Payer: 59 | Admitting: Internal Medicine

## 2015-07-27 ENCOUNTER — Telehealth: Payer: Self-pay | Admitting: Internal Medicine

## 2015-07-27 NOTE — Telephone Encounter (Signed)
Letter printed and mailed to Pt instructing him to call office to schedule routine F/U within the next month.

## 2015-07-27 NOTE — Telephone Encounter (Signed)
Advise patient, needs a follow-up, we need to recheck his cholesterol and PSA. Please arrange office visit

## 2015-08-02 ENCOUNTER — Encounter: Payer: Self-pay | Admitting: Physician Assistant

## 2015-08-02 ENCOUNTER — Ambulatory Visit (INDEPENDENT_AMBULATORY_CARE_PROVIDER_SITE_OTHER): Payer: 59 | Admitting: Physician Assistant

## 2015-08-02 VITALS — BP 148/100 | HR 108 | Temp 100.5°F | Resp 18 | Ht 70.0 in | Wt 290.4 lb

## 2015-08-02 DIAGNOSIS — K5792 Diverticulitis of intestine, part unspecified, without perforation or abscess without bleeding: Secondary | ICD-10-CM | POA: Diagnosis not present

## 2015-08-02 MED ORDER — CIPROFLOXACIN HCL 500 MG PO TABS: 500.0000 mg | ORAL_TABLET | Freq: Two times a day (BID) | ORAL | Status: DC

## 2015-08-02 MED ORDER — METRONIDAZOLE 500 MG PO TABS: 500.0000 mg | ORAL_TABLET | Freq: Three times a day (TID) | ORAL | Status: DC

## 2015-08-02 NOTE — Progress Notes (Signed)
Pre visit review using our clinic review tool, if applicable. No additional management support is needed unless otherwise documented below in the visit note/SLS  

## 2015-08-02 NOTE — Assessment & Plan Note (Signed)
Suspect mild diverticulitis flare. Will check CBC with diff today. Will hold off on imaging due to mild symptoms and only slight tenderness with deep palpation without rebound. Will begin Cipro and Flagyl. Tylenol for pain and/or fever.  Supportive measures discussed. Follow-up scheduled for Friday. ER if anything worsens.

## 2015-08-02 NOTE — Progress Notes (Signed)
Patient presents to clinic today c/o 1.5 days of increased frequency of stools with watery consistency (10 loose stools but very small amount), lower abdominal pain (LLQ only) and cramping and fever as high as 101.3 Denies melena, tenesmus or hematochezia. Denies recent travel. Endorses history of diverticulitis. Endorses some anorexia.   Past Medical History  Diagnosis Date  . HTN (hypertension)   . Gout     Current Outpatient Prescriptions on File Prior to Visit  Medication Sig Dispense Refill  . allopurinol (ZYLOPRIM) 100 MG tablet Take 1 tablet (100 mg total) by mouth daily. (Patient not taking: Reported on 08/02/2015) 30 tablet 12  . EPINEPHrine (EPIPEN JR) 0.15 MG/0.3ML injection Inject 0.15 mg into the muscle as needed for anaphylaxis.    Marland Kitchen indomethacin (INDOCIN) 50 MG capsule Take 1 capsule (50 mg total) by mouth 3 (three) times daily with meals. (Patient not taking: Reported on 08/02/2015) 90 capsule 0   No current facility-administered medications on file prior to visit.    Allergies  Allergen Reactions  . Bee Venom     Other reaction(s): ANAPHYLAXIS  . Peanut Oil     Other reaction(s): ANAPHYLAXIS  . Shellfish-Derived Products     Other reaction(s): ANAPHYLAXIS    Family History  Problem Relation Age of Onset  . CAD Mother     CABG in her 45s  . Stroke Mother   . Diabetes Mother   . Colon cancer Neg Hx   . Prostate cancer Father     dx in his 65s    Social History   Social History  . Marital Status: Married    Spouse Name: N/A  . Number of Children: 1  . Years of Education: N/A   Occupational History  . Administrator, Civil Service , [employer]    Social History Main Topics  . Smoking status: Never Smoker   . Smokeless tobacco: Never Used  . Alcohol Use: No  . Drug Use: No  . Sexual Activity: Not Asked   Other Topics Concern  . None   Social History Narrative   Lives w/ wife , daughter , 31 y/o   Review of Systems - See HPI.  All other ROS are  negative.  BP 148/100 mmHg  Pulse 108  Temp(Src) 100.5 F (38.1 C) (Oral)  Resp 18  Ht  (1.778 m)  Wt 290 lb 6 oz (131.713 kg)  BMI 41.66 kg/m2  SpO2 98%  Physical Exam  Constitutional: He is oriented to person, place, and time and well-developed, well-nourished, and in no distress.  HENT:  Head: Normocephalic and atraumatic.  Cardiovascular: Normal rate, regular rhythm, normal heart sounds and intact distal pulses.   Pulmonary/Chest: Effort normal and breath sounds normal. No respiratory distress. He has no wheezes. He has no rales. He exhibits no tenderness.  Abdominal: Soft. Bowel sounds are normal. He exhibits no distension and no mass. There is tenderness in the left lower quadrant. There is no rebound and no guarding.  Neurological: He is alert and oriented to person, place, and time.  Skin: Skin is warm and dry. No rash noted.  Psychiatric: Affect normal.  Vitals reviewed.  No results found for this or any previous visit (from the past 2160 hour(s)).  Assessment/Plan: Diverticulitis of intestine without perforation or abscess without bleeding Suspect mild diverticulitis flare. Will check CBC with diff today. Will hold off on imaging due to mild symptoms and only slight tenderness with deep palpation without rebound. Will begin Cipro and Flagyl.  Tylenol for pain and/or fever.  Supportive measures discussed. Follow-up scheduled for Friday. ER if anything worsens.

## 2015-08-02 NOTE — Patient Instructions (Signed)
Please go to the lab for blood work.  I will call with results.  Start the two antibiotics as directed. Do not drink alcohol while on these medications.   Increase fluids and rest. Tylenol for pain. Liquids and soft foods until symptoms are improving.  Follow-up with me on Friday. Return immediately or go to the ER if anything worsens.  Diverticulitis Diverticulitis is inflammation or infection of small pouches in your colon that form when you have a condition called diverticulosis. The pouches in your colon are called diverticula. Your colon, or large intestine, is where water is absorbed and stool is formed. Complications of diverticulitis can include:  Bleeding.  Severe infection.  Severe pain.  Perforation of your colon.  Obstruction of your colon. CAUSES  Diverticulitis is caused by bacteria. Diverticulitis happens when stool becomes trapped in diverticula. This allows bacteria to grow in the diverticula, which can lead to inflammation and infection. RISK FACTORS People with diverticulosis are at risk for diverticulitis. Eating a diet that does not include enough fiber from fruits and vegetables may make diverticulitis more likely to develop. SYMPTOMS  Symptoms of diverticulitis may include:  Abdominal pain and tenderness. The pain is normally located on the left side of the abdomen, but may occur in other areas.  Fever and chills.  Bloating.  Cramping.  Nausea.  Vomiting.  Constipation.  Diarrhea.  Blood in your stool. DIAGNOSIS  Your health care provider will ask you about your medical history and do a physical exam. You may need to have tests done because many medical conditions can cause the same symptoms as diverticulitis. Tests may include:  Blood tests.  Urine tests.  Imaging tests of the abdomen, including X-rays and CT scans. When your condition is under control, your health care provider may recommend that you have a colonoscopy. A colonoscopy can  show how severe your diverticula are and whether something else is causing your symptoms. TREATMENT  Most cases of diverticulitis are mild and can be treated at home. Treatment may include:  Taking over-the-counter pain medicines.  Following a clear liquid diet.  Taking antibiotic medicines by mouth for 7-10 days. More severe cases may be treated at a hospital. Treatment may include:  Not eating or drinking.  Taking prescription pain medicine.  Receiving antibiotic medicines through an IV tube.  Receiving fluids and nutrition through an IV tube.  Surgery. HOME CARE INSTRUCTIONS   Follow your health care provider's instructions carefully.  Follow a full liquid diet or other diet as directed by your health care provider. After your symptoms improve, your health care provider may tell you to change your diet. He or she may recommend you eat a high-fiber diet. Fruits and vegetables are good sources of fiber. Fiber makes it easier to pass stool.  Take fiber supplements or probiotics as directed by your health care provider.  Only take medicines as directed by your health care provider.  Keep all your follow-up appointments. SEEK MEDICAL CARE IF:   Your pain does not improve.  You have a hard time eating food.  Your bowel movements do not return to normal. SEEK IMMEDIATE MEDICAL CARE IF:   Your pain becomes worse.  Your symptoms do not get better.  Your symptoms suddenly get worse.  You have a fever.  You have repeated vomiting.  You have bloody or black, tarry stools. MAKE SURE YOU:   Understand these instructions.  Will watch your condition.  Will get help right away if you are not  doing well or get worse.   This information is not intended to replace advice given to you by your health care provider. Make sure you discuss any questions you have with your health care provider.   Document Released: 07/11/2005 Document Revised: 10/06/2013 Document Reviewed:  08/26/2013 Elsevier Interactive Patient Education [employer]

## 2015-08-03 ENCOUNTER — Ambulatory Visit: Payer: 59 | Admitting: Internal Medicine

## 2015-08-03 LAB — CBC WITH DIFFERENTIAL/PLATELET
BASOS ABS: 0 10*3/uL (ref 0.0–0.1)
Basophils Relative: 0.2 % (ref 0.0–3.0)
EOS ABS: 0.2 10*3/uL (ref 0.0–0.7)
Eosinophils Relative: 1.6 % (ref 0.0–5.0)
HCT: 45.5 % (ref 39.0–52.0)
Hemoglobin: 15.1 g/dL (ref 13.0–17.0)
LYMPHS ABS: 1.7 10*3/uL (ref 0.7–4.0)
Lymphocytes Relative: 15.2 % (ref 12.0–46.0)
MCHC: 33.2 g/dL (ref 30.0–36.0)
MCV: 91.7 fl (ref 78.0–100.0)
MONOS PCT: 4.4 % (ref 3.0–12.0)
Monocytes Absolute: 0.5 10*3/uL (ref 0.1–1.0)
NEUTROS PCT: 78.6 % — AB (ref 43.0–77.0)
Neutro Abs: 8.5 10*3/uL — ABNORMAL HIGH (ref 1.4–7.7)
Platelets: 234 10*3/uL (ref 150.0–400.0)
RBC: 4.96 Mil/uL (ref 4.22–5.81)
RDW: 13.7 % (ref 11.5–15.5)
WBC: 10.8 10*3/uL — ABNORMAL HIGH (ref 4.0–10.5)

## 2015-08-05 ENCOUNTER — Encounter: Payer: Self-pay | Admitting: Physician Assistant

## 2015-08-05 ENCOUNTER — Ambulatory Visit (INDEPENDENT_AMBULATORY_CARE_PROVIDER_SITE_OTHER): Payer: 59 | Admitting: Physician Assistant

## 2015-08-05 VITALS — BP 137/100 | HR 88 | Temp 98.3°F | Resp 16 | Ht 70.0 in | Wt 288.2 lb

## 2015-08-05 DIAGNOSIS — K5792 Diverticulitis of intestine, part unspecified, without perforation or abscess without bleeding: Secondary | ICD-10-CM | POA: Diagnosis not present

## 2015-08-05 NOTE — Patient Instructions (Signed)
I am glad you are feeling better. Please finish entire course of antibiotic. Again remember a bland diet is best until symptoms are resolved. If your symptoms worsen over the weekend, go to an ER facility. Otherwise we will follow-up if symptoms are not completely resolved by completion of antibiotics.

## 2015-08-05 NOTE — Progress Notes (Signed)
Pre visit review using our clinic review tool, if applicable. No additional management support is needed unless otherwise documented below in the visit note/SLS  

## 2015-08-05 NOTE — Assessment & Plan Note (Signed)
Improving. Fever has resolved. Tenderness on exam is very mild compared to last check 3 days ago. Will finish course of antibiotics. Diet again reviewed. Will follow-up if any symptom persists after completion of antibiotic. Alarm signs/symptoms discussed with patient.

## 2015-08-05 NOTE — Progress Notes (Signed)
Patient presents to clinic today for follow-up of acute diverticulitis. Patient seen earlier in week when diagnosis was made. Wanted to follow-up before the weekend. Is taking antibiotics as directed without side effects. Endorses pain is improving. Now at 3/10 instead of 9/10 at last visit. Fever has resolved per patient. Is still having some mild loose stools but much improved from last visit. Denies new or worsening symptoms.  Past Medical History  Diagnosis Date  . HTN (hypertension)   . Gout     Current Outpatient Prescriptions on File Prior to Visit  Medication Sig Dispense Refill  . ciprofloxacin (CIPRO) 500 MG tablet Take 1 tablet (500 mg total) by mouth 2 (two) times daily. 14 tablet 0  . EPINEPHrine (EPIPEN JR) 0.15 MG/0.3ML injection Inject 0.15 mg into the muscle as needed for anaphylaxis.    Marland Kitchen metroNIDAZOLE (FLAGYL) 500 MG tablet Take 1 tablet (500 mg total) by mouth 3 (three) times daily. 21 tablet 0  . allopurinol (ZYLOPRIM) 100 MG tablet Take 1 tablet (100 mg total) by mouth daily. (Patient not taking: Reported on 08/02/2015) 30 tablet 12   No current facility-administered medications on file prior to visit.    Allergies  Allergen Reactions  . Bee Venom     Other reaction(s): ANAPHYLAXIS  . Peanut Oil     Other reaction(s): ANAPHYLAXIS  . Shellfish-Derived Products     Other reaction(s): ANAPHYLAXIS    Family History  Problem Relation Age of Onset  . CAD Mother     CABG in her 39s  . Stroke Mother   . Diabetes Mother   . Colon cancer Neg Hx   . Prostate cancer Father     dx in his 35s    Social History   Social History  . Marital Status: Married    Spouse Name: N/A  . Number of Children: 1  . Years of Education: N/A   Occupational History  . Administrator, Civil Service , [employer]    Social History Main Topics  . Smoking status: Never Smoker   . Smokeless tobacco: Never Used  . Alcohol Use: No  . Drug Use: No  . Sexual Activity: Not Asked   Other  Topics Concern  . None   Social History Narrative   Lives w/ wife , daughter , 53 y/o    Review of Systems - See HPI.  All other ROS are negative.  BP 137/100 mmHg  Pulse 88  Temp(Src) 98.3 F (36.8 C) (Oral)  Resp 16  Ht  (1.778 m)  Wt 288 lb 4 oz (130.749 kg)  BMI 41.36 kg/m2  SpO2 100%  Physical Exam  Constitutional: He is oriented to person, place, and time and well-developed, well-nourished, and in no distress.  HENT:  Head: Normocephalic and atraumatic.  Eyes: Conjunctivae are normal.  Cardiovascular: Normal rate, regular rhythm, normal heart sounds and intact distal pulses.   Pulmonary/Chest: Effort normal and breath sounds normal. No respiratory distress. He has no wheezes. He has no rales. He exhibits no tenderness.  Abdominal: There is no hepatosplenomegaly. There is tenderness in the left lower quadrant. There is no CVA tenderness. No hernia.  Neurological: He is alert and oriented to person, place, and time.  Skin: Skin is warm and dry. No rash noted.  Psychiatric: Affect normal.  Vitals reviewed.   Recent Results (from the past 2160 hour(s))  CBC w/Diff     Status: Abnormal   Collection Time: 08/02/15  2:55 PM  Result Value  Ref Range   WBC 10.8 (H) 4.0 - 10.5 K/uL   RBC 4.96 4.22 - 5.81 Mil/uL   Hemoglobin 15.1 13.0 - 17.0 g/dL   HCT 40.945.5 81.139.0 - 91.452.0 %   MCV 91.7 78.0 - 100.0 fl   MCHC 33.2 30.0 - 36.0 g/dL   RDW 78.213.7 95.611.5 - 21.315.5 %   Platelets 234.0 150.0 - 400.0 K/uL   Neutrophils Relative % 78.6 (H) 43.0 - 77.0 %   Lymphocytes Relative 15.2 12.0 - 46.0 %   Monocytes Relative 4.4 3.0 - 12.0 %   Eosinophils Relative 1.6 0.0 - 5.0 %   Basophils Relative 0.2 0.0 - 3.0 %   Neutro Abs 8.5 (H) 1.4 - 7.7 K/uL   Lymphs Abs 1.7 0.7 - 4.0 K/uL   Monocytes Absolute 0.5 0.1 - 1.0 K/uL   Eosinophils Absolute 0.2 0.0 - 0.7 K/uL   Basophils Absolute 0.0 0.0 - 0.1 K/uL    Assessment/Plan: Diverticulitis of intestine without perforation or abscess without  bleeding Improving. Fever has resolved. Tenderness on exam is very mild compared to last check 3 days ago. Will finish course of antibiotics. Diet again reviewed. Will follow-up if any symptom persists after completion of antibiotic. Alarm signs/symptoms discussed with patient.

## 2015-08-10 ENCOUNTER — Telehealth: Payer: Self-pay | Admitting: Internal Medicine

## 2015-08-10 NOTE — Telephone Encounter (Signed)
Caller name: Wyvonne LenzGeorge Uhlig  Relationship to patient: Self   Can be reached: [phone]    Reason for call:  Pt says that [employer] is going to be sending in a form, pt isn't sure of what form. He think that it may be a FMLA. Pt would like to have receipt confirmation.

## 2015-08-24 NOTE — Telephone Encounter (Signed)
Nothing has been received for pt as of today. JG//CMA

## 2015-12-26 ENCOUNTER — Telehealth: Payer: Self-pay | Admitting: Internal Medicine

## 2015-12-26 NOTE — Telephone Encounter (Signed)
LM for pt to call and schedule flu shot or update records and schedule f/u with Dr. Drue NovelPaz (was due in 07/2015)

## 2016-01-31 ENCOUNTER — Encounter: Payer: Self-pay | Admitting: *Deleted

## 2016-01-31 ENCOUNTER — Encounter: Payer: Self-pay | Admitting: Family Medicine

## 2016-01-31 ENCOUNTER — Ambulatory Visit (INDEPENDENT_AMBULATORY_CARE_PROVIDER_SITE_OTHER): Payer: PRIVATE HEALTH INSURANCE | Admitting: Family Medicine

## 2016-01-31 VITALS — BP 140/90 | Temp 100.3°F

## 2016-01-31 DIAGNOSIS — M1000 Idiopathic gout, unspecified site: Secondary | ICD-10-CM

## 2016-01-31 MED ORDER — ALLOPURINOL 300 MG PO TABS: 300.0000 mg | ORAL_TABLET | Freq: Every day | ORAL | Status: DC

## 2016-01-31 MED ORDER — PREDNISONE 20 MG PO TABS: ORAL_TABLET | ORAL | Status: DC

## 2016-01-31 MED ORDER — HYDROCODONE-ACETAMINOPHEN 10-325 MG PO TABS: 1.0000 | ORAL_TABLET | Freq: Three times a day (TID) | ORAL | Status: DC | PRN

## 2016-01-31 MED ORDER — KETOROLAC TROMETHAMINE 60 MG/2ML IM SOLN
60.0000 mg | Freq: Once | INTRAMUSCULAR | Status: AC
  Administered 2016-01-31: 60 mg via INTRAMUSCULAR

## 2016-01-31 NOTE — Progress Notes (Signed)
Pre visit review using our clinic review tool, if applicable. No additional management support is needed unless otherwise documented below in the visit note. 

## 2016-01-31 NOTE — Patient Instructions (Signed)
Uric acid level today......... call your doctor in [facility] on Thursday for follow-up and to get your lab report  Prednisone 20 mg........ 2 tabs now.... 2 tabs tonight........ then 2 tabs 3 days with a taper as outlined  Allopurinol 300 milligrams.......Marland Kitchen. 1 daily forever  Vicodin ES...Marland Kitchen.Marland Kitchen.Marland Kitchen. 1/2-1 tab every 4-6 hours as needed for pain  Elevation and ice for 2-3 days until the pain and swelling begins to diminish

## 2016-01-31 NOTE — Progress Notes (Signed)
   Subjective:    Patient ID: Sherene SiresGeorge D Billey, male    DOB: [birth year], 55 y.o.   MRN: [id number]  HPI Greggory StallionGeorge is a 55 year old married male nonsmoker nondrinker who comes in today from [facility] [place] because his office that he normally goes to told him they were 100% blocked for evaluation of acute gout  He has a history of gout. His last attack was a year ago. He his pain now is a 12 on a scale of 1-10. This attack started 2 days ago. No trauma no alcohol.   Review of Systems Review of systems otherwise negative    Objective:   Physical Exam  Well-developed well-nourished male no acute distress vital signs stable he's afebrile examination left ankle shows the ankle be swollen and tender to even light touch normal pulse no ulcerations      Assessment & Plan:  Acute gout................. Toradol 60 mg male......... check uric acid level..... Started on prednisone........ allopurinol 300 mg daily for life.

## 2016-02-01 LAB — URIC ACID: URIC ACID, SERUM: 9 mg/dL — AB (ref 4.0–7.8)

## 2016-02-08 ENCOUNTER — Encounter: Payer: Self-pay | Admitting: Family Medicine

## 2016-02-28 ENCOUNTER — Other Ambulatory Visit: Payer: Self-pay | Admitting: Family Medicine

## 2016-04-10 ENCOUNTER — Encounter: Payer: Self-pay | Admitting: Medical

## 2016-04-10 ENCOUNTER — Ambulatory Visit (INDEPENDENT_AMBULATORY_CARE_PROVIDER_SITE_OTHER): Payer: PRIVATE HEALTH INSURANCE | Admitting: Medical

## 2016-04-10 VITALS — BP 140/92 | HR 75 | Temp 98.1°F | Ht 70.0 in | Wt 287.2 lb

## 2016-04-10 DIAGNOSIS — M1000 Idiopathic gout, unspecified site: Secondary | ICD-10-CM | POA: Diagnosis not present

## 2016-04-10 LAB — URIC ACID: Uric Acid, Serum: 4.2 mg/dL (ref 4.0–7.8)

## 2016-04-10 MED ORDER — PREDNISONE 20 MG PO TABS: ORAL_TABLET | ORAL | Status: DC

## 2016-04-10 MED ORDER — HYDROCODONE-ACETAMINOPHEN 10-325 MG PO TABS: 1.0000 | ORAL_TABLET | Freq: Three times a day (TID) | ORAL | Status: DC | PRN

## 2016-04-10 NOTE — Progress Notes (Signed)
Pre visit review using our clinic review tool, if applicable. No additional management support is needed unless otherwise documented below in the visit note. 

## 2016-04-10 NOTE — Progress Notes (Signed)
   Subjective:    Patient ID: Ian SiresGeorge D Wilson, male    DOB: April 24, 1961, 55 y.o.   MRN: [id number]  HPI   Pt in with left great toe pain for about 2 days. This am pain increased. Hx of gout flare in ankle and feet in the past. Last flare April 18th. Pt is on allopurinol. Pt forgot 2 tab of allopurinol last week. Pt has done well in past to stop flares with prednisone and norco.  Pt in April had uric acid level 9 in April.  Pain 7/10.     Review of Systems  Constitutional: Negative for fever, chills and fatigue.  Respiratory: Negative for cough and wheezing.   Cardiovascular: Positive for palpitations. Negative for chest pain.  Musculoskeletal:       Lt great toe pain.   Past Medical History  Diagnosis Date  . HTN (hypertension)   . Gout      Social History   Social History  . Marital Status: Married    Spouse Name: N/A  . Number of Children: 1  . Years of Education: N/A   Occupational History  . Administrator, Civil Servicesales consultant , [employer]    Social History Main Topics  . Smoking status: Never Smoker   . Smokeless tobacco: Never Used  . Alcohol Use: No  . Drug Use: No  . Sexual Activity: Not on file   Other Topics Concern  . Not on file   Social History Narrative   Lives w/ wife , daughter , 54 y/o    Past Surgical History  Procedure Laterality Date  . No past surgeries      Family History  Problem Relation Age of Onset  . CAD Mother     CABG in her 3050s  . Stroke Mother   . Diabetes Mother   . Colon cancer Neg Hx   . Prostate cancer Father     dx in his 2170s    Allergies  Allergen Reactions  . Bee Venom     Other reaction(s): ANAPHYLAXIS  . Peanut Oil     Other reaction(s): ANAPHYLAXIS  . Shellfish-Derived Products     Other reaction(s): ANAPHYLAXIS    Current Outpatient Prescriptions on File Prior to Visit  Medication Sig Dispense Refill  . allopurinol (ZYLOPRIM) 300 MG tablet Take 1 tablet (300 mg total) by mouth daily. 100 tablet 3  .  EPINEPHrine (EPIPEN JR) 0.15 MG/0.3ML injection Inject 0.15 mg into the muscle as needed for anaphylaxis.     No current facility-administered medications on file prior to visit.    BP 140/92 mmHg  Pulse 75  Temp(Src) 98.1 F (36.7 C) (Oral)  Ht 5\' 10"  (1.778 m)  Wt 287 lb 3.2 oz (130.273 kg)  BMI 41.21 kg/m2  SpO2 98%       Objective:   Physical Exam   General- No acute distress. Pleasant patient.  Lungs- Clear, even and unlabored. Heart- regular rate and rhythm. Neurologic- CNII- XII grossly intact.  Left foot- no swelling. But mild swollen and tender at base of great toe. Faint warmth.      Assessment & Plan:  You appear to have had gout flare in great toe. Start rx of prednisone and norco. Please get uric acid level today.  Your pain should gradually decrease if not let us know.  We will call you on your lab results.  Follow up in 2 wks or as needed.

## 2016-04-10 NOTE — Patient Instructions (Signed)
You appear to have had gout flare in great toe. Start rx of prednisone and norco. Please get uric acid level today.  Your pain should gradually decrease if not let us know.  We will call you on your lab results.  Follow up in 2 wks or as needed.

## 2016-04-24 ENCOUNTER — Ambulatory Visit: Payer: PRIVATE HEALTH INSURANCE | Admitting: Medical

## 2016-06-07 ENCOUNTER — Telehealth: Payer: Self-pay | Admitting: Internal Medicine

## 2016-06-07 NOTE — Telephone Encounter (Signed)
Awaiting forms

## 2016-06-07 NOTE — Telephone Encounter (Signed)
Caller name: Florentina AddisonKatie from [employer]  Call back number: [phone] and fax # [phone]   Reason for call:  Checking on the status of standard [employer] form if received (chart doesn't reflect). Request agent to re fax to [phone] attention Denzil HughesKaylyn F.. Agent requesting forms to fax back to [phone] and to the # stated on the form.

## 2016-06-12 ENCOUNTER — Telehealth: Payer: Self-pay

## 2016-06-12 ENCOUNTER — Encounter: Payer: PRIVATE HEALTH INSURANCE | Admitting: Internal Medicine

## 2016-06-12 NOTE — Telephone Encounter (Signed)
Noted  

## 2016-06-12 NOTE — Telephone Encounter (Signed)
Patient's wife called upset patient was turned away from visit because he was not fasting and also 15 minutes late for CPE I educated the wife of the 10 minute late policy and said that is why we turned patient away. Patient is scheduled to come in 06/19/16 at 3:30 for CPE and will have labs drawn the next morning because he cannot fast that long.   This is an FYI in case something comes up during the visit I just wanted to make you aware.

## 2016-06-19 ENCOUNTER — Encounter: Payer: Self-pay | Admitting: Internal Medicine

## 2016-06-19 ENCOUNTER — Ambulatory Visit (INDEPENDENT_AMBULATORY_CARE_PROVIDER_SITE_OTHER): Payer: PRIVATE HEALTH INSURANCE | Admitting: Internal Medicine

## 2016-06-19 VITALS — BP 124/76 | HR 82 | Temp 98.9°F | Resp 14 | Ht 70.0 in | Wt 287.4 lb

## 2016-06-19 DIAGNOSIS — Z125 Encounter for screening for malignant neoplasm of prostate: Secondary | ICD-10-CM

## 2016-06-19 DIAGNOSIS — M109 Gout, unspecified: Secondary | ICD-10-CM

## 2016-06-19 DIAGNOSIS — Z09 Encounter for follow-up examination after completed treatment for conditions other than malignant neoplasm: Secondary | ICD-10-CM | POA: Insufficient documentation

## 2016-06-19 DIAGNOSIS — Z0000 Encounter for general adult medical examination without abnormal findings: Secondary | ICD-10-CM | POA: Diagnosis not present

## 2016-06-19 DIAGNOSIS — Z114 Encounter for screening for human immunodeficiency virus [HIV]: Secondary | ICD-10-CM

## 2016-06-19 MED ORDER — EPINEPHRINE 0.3 MG/0.3ML IJ SOAJ: 0.3000 mg | Freq: Once | INTRAMUSCULAR | 1 refills | Status: AC

## 2016-06-19 MED ORDER — EPINEPHRINE 0.15 MG/0.3ML IJ SOAJ: 0.1500 mg | INTRAMUSCULAR | 1 refills | Status: DC | PRN

## 2016-06-19 NOTE — Progress Notes (Signed)
Subjective:    Patient ID: Ian Wilson, male    DOB: [birth year], 55 y.o.   MRN: [id number]  DOS:  06/19/2016 Type of visit - description : CPX, here with his wife Interval history: No major concerns    Review of Systems Constitutional: No fever. No chills. No unexplained wt changes. No unusual sweats  HEENT: No dental problems, no ear discharge, no facial swelling, no voice changes. No eye discharge, no eye  redness , no  intolerance to light   Respiratory: No wheezing , no  difficulty breathing. No cough , no mucus production  Cardiovascular: No CP, no leg swelling , no  Palpitations  GI: no nausea, no vomiting, no diarrhea , no  abdominal pain.  No blood in the stools. No dysphagia, no odynophagia    Endocrine: No polyphagia, no polyuria , no polydipsia  GU: No dysuria, gross hematuria, difficulty urinating. No urinary urgency, no frequency.  Musculoskeletal: Continue with knee pain, does not limit  his ADLs but is not able to exercise. Skin: No change in the color of the skin, palor , no  Rash  Allergic, immunologic: No environmental allergies , no  food allergies  Neurological: No dizziness no  syncope. No headaches. No diplopia, no slurred, no slurred speech, no motor deficits, no facial  Numbness  Hematological: No enlarged lymph nodes, no easy bruising , no unusual bleedings  Psychiatry: No suicidal ideas, no hallucinations, no beavior problems, no confusion.  No unusual/severe anxiety, no depression   Past Medical History:  Diagnosis Date  . Gout   . HTN (hypertension)     Past Surgical History:  Procedure Laterality Date  . NO PAST SURGERIES      Social History   Social History  . Marital status: Married    Spouse name: N/A  . Number of children: 1  . Years of education: N/A   Occupational History  . sales      Social History Main Topics  . Smoking status: Never Smoker  . Smokeless tobacco: Never Used  . Alcohol use No  . Drug use: No    . Sexual activity: Not on file   Other Topics Concern  . Not on file   Social History Narrative   Lives w/ wife    Family History  Problem Relation Age of Onset  . CAD Mother     CABG in her 75s  . Stroke Mother   . Diabetes Mother   . Prostate cancer Father     dx in his 69s  . Colon cancer Neg Hx         Medication List       Accurate as of 06/19/16  6:42 PM. Always use your most recent med list.          allopurinol 300 MG tablet Commonly known as:  ZYLOPRIM Take 1 tablet (300 mg total) by mouth daily.   EPINEPHrine 0.3 mg/0.3 mL Soaj injection Commonly known as:  EPIPEN 2-PAK Inject 0.3 mLs (0.3 mg total) into the muscle once.   ibuprofen 200 MG tablet Commonly known as:  ADVIL,MOTRIN Take 200 mg by mouth every 6 (six) hours as needed.          Objective:   Physical Exam BP 124/76 (BP Location: Left Arm, Patient Position: Sitting, Cuff Size: Normal)   Pulse 82   Temp 98.9 F (37.2 C) (Oral)   Resp 14   Ht 5\' 10"  (1.778 m)   Wt 287 lb  6 oz (130.4 kg)   SpO2 98%   BMI 41.23 kg/m   General:   Well developed, well nourished . NAD.  Neck: No  thyromegaly  HEENT:  Normocephalic . Face symmetric, atraumatic Lungs:  CTA B Normal respiratory effort, no intercostal retractions, no accessory muscle use. Heart: RRR,  no murmur.  No pretibial edema bilaterally  Abdomen:  Not distended, soft, non-tender. No rebound or rigidity.   Rectal:  External abnormalities: none. Normal sphincter tone. No rectal masses or tenderness.  Stool brown  Prostate: Prostate gland firm and smooth, no enlargement, nodularity, tenderness, mass, asymmetry or induration.  Skin: Exposed areas without rash. Not pale. Not jaundice Neurologic:  alert & oriented X3.  Speech normal, gait very difficult due to knee pain. Limping. Strength symmetric and appropriate for age.  Psych: Cognition and judgment appear intact.  Cooperative with normal attention span and concentration.   Behavior appropriate. No anxious or depressed appearing.    Assessment & Plan:   Assessment HTN Gout DJD, knees  H/o diverticulitis 2016 Allergies: peanut oil, shellfish On EpiPen  PLAN: HTN? 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 Knows to call me if he changes his mind RTC one year

## 2016-06-19 NOTE — Patient Instructions (Signed)
GO TO THE LAB : Get the blood work     GO TO THE FRONT DESK Schedule your next appointment for a physical exam in one year, fasting   

## 2016-06-19 NOTE — Assessment & Plan Note (Addendum)
Tdap--2009  Declined a flu shot Had a cscope [employer] [facility]([place]),   ~ 2012, pt was told normal , no report available   + FH heart disease mother at age 54 + FH father prostate cancer at age 54. Last PSA was a slightly elevated, DRE today negative, recheck a PSA. Diet discussed, unable to exercise due to knee pain.   Labs: CMP, CBC, FLP, PSA, HIV, uric acid

## 2016-06-19 NOTE — Progress Notes (Signed)
Pre visit review using our clinic review tool, if applicable. No additional management support is needed unless otherwise documented below in the visit note. 

## 2016-06-19 NOTE — Assessment & Plan Note (Signed)
HTN? 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 Knows to call me if he changes his mind RTC one year

## 2016-06-20 LAB — CBC WITH DIFFERENTIAL/PLATELET
BASOS PCT: 0.2 % (ref 0.0–3.0)
Basophils Absolute: 0 10*3/uL (ref 0.0–0.1)
EOS ABS: 0 10*3/uL (ref 0.0–0.7)
EOS PCT: 0.4 % (ref 0.0–5.0)
HCT: 45.2 % (ref 39.0–52.0)
Hemoglobin: 15.1 g/dL (ref 13.0–17.0)
Lymphocytes Relative: 21.8 % (ref 12.0–46.0)
Lymphs Abs: 2.5 10*3/uL (ref 0.7–4.0)
MCHC: 33.3 g/dL (ref 30.0–36.0)
MCV: 92.6 fl (ref 78.0–100.0)
MONO ABS: 0.8 10*3/uL (ref 0.1–1.0)
Monocytes Relative: 6.5 % (ref 3.0–12.0)
NEUTROS ABS: 8.2 10*3/uL — AB (ref 1.4–7.7)
Neutrophils Relative %: 71.1 % (ref 43.0–77.0)
PLATELETS: 237 10*3/uL (ref 150.0–400.0)
RBC: 4.88 Mil/uL (ref 4.22–5.81)
RDW: 14 % (ref 11.5–15.5)
WBC: 11.6 10*3/uL — ABNORMAL HIGH (ref 4.0–10.5)

## 2016-06-20 LAB — HIV ANTIBODY (ROUTINE TESTING W REFLEX): HIV: NONREACTIVE

## 2016-06-20 LAB — COMPREHENSIVE METABOLIC PANEL
ALBUMIN: 4.2 g/dL (ref 3.5–5.2)
ALT: 16 U/L (ref 0–53)
AST: 13 U/L (ref 0–37)
Alkaline Phosphatase: 67 U/L (ref 39–117)
BUN: 15 mg/dL (ref 6–23)
CALCIUM: 9.2 mg/dL (ref 8.4–10.5)
CHLORIDE: 107 meq/L (ref 96–112)
CO2: 27 mEq/L (ref 19–32)
Creatinine, Ser: 1.28 mg/dL (ref 0.40–1.50)
GFR: 75.32 mL/min (ref >60.00)
Glucose, Bld: 85 mg/dL (ref 70–99)
POTASSIUM: 3.9 meq/L (ref 3.5–5.1)
SODIUM: 142 meq/L (ref 135–145)
Total Bilirubin: 0.7 mg/dL (ref 0.2–1.2)
Total Protein: 6.9 g/dL (ref 6.0–8.3)

## 2016-06-20 LAB — LIPID PANEL
CHOLESTEROL: 218 mg/dL — AB (ref 0–200)
HDL: 45.5 mg/dL (ref >39.00)
LDL CALC: 151 mg/dL — AB (ref 0–99)
NonHDL: 172.26
TRIGLYCERIDES: 106 mg/dL (ref 0.0–149.0)
Total CHOL/HDL Ratio: 5
VLDL: 21.2 mg/dL (ref 0.0–40.0)

## 2016-06-20 LAB — URIC ACID: Uric Acid, Serum: 5.4 mg/dL (ref 4.0–7.8)

## 2016-06-20 LAB — PSA: PSA: 0.51 ng/mL (ref 0.10–4.00)

## 2016-06-22 ENCOUNTER — Encounter: Payer: Self-pay | Admitting: Family

## 2016-07-23 ENCOUNTER — Ambulatory Visit (INDEPENDENT_AMBULATORY_CARE_PROVIDER_SITE_OTHER): Payer: PRIVATE HEALTH INSURANCE | Admitting: Orthopaedic Surgery

## 2016-07-23 ENCOUNTER — Encounter: Payer: Self-pay | Admitting: Internal Medicine

## 2016-07-23 ENCOUNTER — Ambulatory Visit (INDEPENDENT_AMBULATORY_CARE_PROVIDER_SITE_OTHER): Payer: PRIVATE HEALTH INSURANCE | Admitting: Internal Medicine

## 2016-07-23 VITALS — BP 146/87 | HR 62 | Temp 98.6°F | Resp 16 | Ht 70.0 in | Wt 286.0 lb

## 2016-07-23 DIAGNOSIS — M25562 Pain in left knee: Secondary | ICD-10-CM | POA: Diagnosis not present

## 2016-07-23 DIAGNOSIS — M1712 Unilateral primary osteoarthritis, left knee: Secondary | ICD-10-CM | POA: Diagnosis not present

## 2016-07-23 DIAGNOSIS — M1711 Unilateral primary osteoarthritis, right knee: Secondary | ICD-10-CM | POA: Diagnosis not present

## 2016-07-23 NOTE — Progress Notes (Signed)
Pre visit review using our clinic review tool, if applicable. No additional management support is needed unless otherwise documented below in the visit note. 

## 2016-07-23 NOTE — Patient Instructions (Addendum)
Rest, ice, ibuprofen.  Will get somebody to see you today  ER if fever, chills, severe symptoms.

## 2016-07-23 NOTE — Progress Notes (Signed)
   Subjective:    Patient ID: Ian Wilson, male    DOB: 20-Sep-1961, 55 y.o.   MRN: [id number]  DOS:  07/23/2016 Type of visit - description : acute , here w/ wife Interval history: Chronic left knee discomfort, worse for the last 2 days, he "twisted" the L  knee and since then is swollen, somewhat warm, has a hard time stretching it all the  way.    Review of Systems No fever or chills  Past Medical History:  Diagnosis Date  . Gout   . HTN (hypertension)     Past Surgical History:  Procedure Laterality Date  . NO PAST SURGERIES      Social History   Social History  . Marital status: Married    Spouse name: N/A  . Number of children: 1  . Years of education: N/A   Occupational History  . sales      Social History Main Topics  . Smoking status: Never Smoker  . Smokeless tobacco: Never Used  . Alcohol use No  . Drug use: No  . Sexual activity: Not on file   Other Topics Concern  . Not on file   Social History Narrative   Lives w/ wife         Medication List       Accurate as of 07/23/16 12:06 PM. Always use your most recent med list.          allopurinol 300 MG tablet Commonly known as:  ZYLOPRIM Take 1 tablet (300 mg total) by mouth daily.   ibuprofen 200 MG tablet Commonly known as:  ADVIL,MOTRIN Take 200 mg by mouth every 6 (six) hours as needed.          Objective:   Physical Exam BP (!) 146/87 (BP Location: Left Arm, Patient Position: Sitting, Cuff Size: Large)   Pulse 62   Temp 98.6 F (37 C) (Oral)   Resp 16   Ht 5\' 10"  (1.778 m)   Wt 286 lb (129.7 kg)   SpO2 96%   BMI 41.04 kg/m  General:   Well developed, well nourished . NAD.   MSK: Right knee with some changes suggestive of DJD Left knee: Moderate effusion, slightly warm, not red, range of motions are very limited by pain Skin: Not pale. Not jaundice Neurologic:  alert & oriented X3.  Speech normal, gait very limited due to pain, assisted with a walker Psych--    Cognition and judgment appear intact.  Cooperative with normal attention span and concentration.  Behavior appropriate. No anxious or depressed appearing.      Assessment & Plan:   Assessment HTN Gout DJD, knees  H/o diverticulitis 2016 Allergies: peanut oil, shellfish On EpiPen  PLAN: Acute on chronic left knee pain: DJD versus gout versus others. Will refer to sports medicine or ortho. Knee tap to differentiate etiology? Also will need long-term management of chronic pain

## 2016-07-23 NOTE — Assessment & Plan Note (Signed)
Acute on chronic left knee pain: DJD versus gout versus others. Will refer to sports medicine or ortho. Knee tap to differentiate etiology? Also will need long-term management of chronic pain

## 2016-08-09 ENCOUNTER — Other Ambulatory Visit (INDEPENDENT_AMBULATORY_CARE_PROVIDER_SITE_OTHER): Payer: Self-pay | Admitting: Orthopaedic Surgery

## 2016-08-21 ENCOUNTER — Encounter: Payer: PRIVATE HEALTH INSURANCE | Admitting: Internal Medicine

## 2016-12-08 ENCOUNTER — Emergency Department (HOSPITAL_COMMUNITY): Payer: 59

## 2016-12-08 ENCOUNTER — Emergency Department (HOSPITAL_COMMUNITY)
Admission: EM | Admit: 2016-12-08 | Discharge: 2016-12-08 | Disposition: A | Discharge status: Home / Self Care | Payer: 59 | Attending: Emergency Medicine | Admitting: Emergency Medicine

## 2016-12-08 ENCOUNTER — Encounter (HOSPITAL_COMMUNITY): Payer: Self-pay | Admitting: Emergency Medicine

## 2016-12-08 DIAGNOSIS — R51 Headache: Secondary | ICD-10-CM | POA: Diagnosis not present

## 2016-12-08 DIAGNOSIS — Z91010 Allergy to peanuts: Secondary | ICD-10-CM | POA: Diagnosis not present

## 2016-12-08 DIAGNOSIS — Z79899 Other long term (current) drug therapy: Secondary | ICD-10-CM | POA: Insufficient documentation

## 2016-12-08 DIAGNOSIS — I10 Essential (primary) hypertension: Secondary | ICD-10-CM | POA: Diagnosis not present

## 2016-12-08 DIAGNOSIS — R519 Headache, unspecified: Secondary | ICD-10-CM

## 2016-12-08 LAB — CBC WITH DIFFERENTIAL/PLATELET
Basophils Absolute: 0 10*3/uL (ref 0.0–0.1)
Basophils Relative: 0 %
EOS ABS: 0.5 10*3/uL (ref 0.0–0.7)
Eosinophils Relative: 6 %
HEMATOCRIT: 41.6 % (ref 39.0–52.0)
HEMOGLOBIN: 14.1 g/dL (ref 13.0–17.0)
LYMPHS ABS: 2.3 10*3/uL (ref 0.7–4.0)
LYMPHS PCT: 30 %
MCH: 30.8 pg (ref 26.0–34.0)
MCHC: 33.9 g/dL (ref 30.0–36.0)
MCV: 90.8 fL (ref 78.0–100.0)
MONOS PCT: 7 %
Monocytes Absolute: 0.5 10*3/uL (ref 0.1–1.0)
NEUTROS ABS: 4.2 10*3/uL (ref 1.7–7.7)
NEUTROS PCT: 57 %
Platelets: 239 10*3/uL (ref 150–400)
RBC: 4.58 MIL/uL (ref 4.22–5.81)
RDW: 12.9 % (ref 11.5–15.5)
WBC: 7.4 10*3/uL (ref 4.0–10.5)

## 2016-12-08 LAB — COMPREHENSIVE METABOLIC PANEL
ALT: 18 U/L (ref 17–63)
AST: 18 U/L (ref 15–41)
Albumin: 4.1 g/dL (ref 3.5–5.0)
Alkaline Phosphatase: 65 U/L (ref 38–126)
Anion gap: 5 (ref 5–15)
BUN: 13 mg/dL (ref 6–20)
CHLORIDE: 109 mmol/L (ref 101–111)
CO2: 28 mmol/L (ref 22–32)
Calcium: 8.8 mg/dL — ABNORMAL LOW (ref 8.9–10.3)
Creatinine, Ser: 1.2 mg/dL (ref 0.61–1.24)
Glucose, Bld: 98 mg/dL (ref 65–99)
POTASSIUM: 4 mmol/L (ref 3.5–5.1)
SODIUM: 142 mmol/L (ref 135–145)
Total Bilirubin: 0.5 mg/dL (ref 0.3–1.2)
Total Protein: 7 g/dL (ref 6.5–8.1)

## 2016-12-08 LAB — CBG MONITORING, ED: GLUCOSE-CAPILLARY: 72 mg/dL (ref 65–99)

## 2016-12-08 LAB — URINALYSIS, ROUTINE W REFLEX MICROSCOPIC
BILIRUBIN URINE: NEGATIVE
Glucose, UA: NEGATIVE mg/dL
HGB URINE DIPSTICK: NEGATIVE
Ketones, ur: NEGATIVE mg/dL
Leukocytes, UA: NEGATIVE
NITRITE: NEGATIVE
PROTEIN: NEGATIVE mg/dL
SPECIFIC GRAVITY, URINE: 1.018 (ref 1.005–1.030)
pH: 6 (ref 5.0–8.0)

## 2016-12-08 NOTE — ED Triage Notes (Addendum)
Patient is complaining of a headache. Patient states he has never felt a headache like this before. Patient states it happened around 5:23pm. Patient states EMS came and his blood pressure was high. Patient did not have any injury. Patient felt dizzy and blurred vision. Patient felt like he was going to pass out.

## 2016-12-09 NOTE — ED Provider Notes (Signed)
[facility]-EMERGENCY DEPT Provider Note   CSN: [id number] Arrival date & time: 12/08/16  [id number]     History   Chief Complaint Chief Complaint  Patient presents with  . Headache    HPI Ian Wilson is a 56 y.o. male.  HPI Patient presents emergency department with complaints of severe acute onset right-sided headache which began suddenly while at work.  Last 5 or 10 minutes and then resolved.  He did immediately have some nausea but he never vomited.  No nausea vomiting now.  No headache now.  No prior history of aneurysm.  No family history of aneurysms.  No chest pain shortness breath.  Feels fine.  Would just like his head checked out.  At that time he felt like he is maybe what to pass out.  No preceding palpitations.   Past Medical History:  Diagnosis Date  . Gout   . HTN (hypertension)     Patient Active Problem List   Diagnosis Date Noted  . PCP NOTES >>>>>>>>>>>>>>>>> 06/19/2016  . Diverticulitis of intestine without perforation or abscess without bleeding 08/02/2015  . Ankle pain 12/16/2014  . Diarrhea, Intermittent, lactose intolerance ? 04/13/2014  . Annual physical exam 12/25/2013  . DJD (degenerative joint disease) 11/18/2013  . Gout 10/29/2012  . HTN (hypertension)     Past Surgical History:  Procedure Laterality Date  . NO PAST SURGERIES         Home Medications    Prior to Admission medications   Medication Sig Start Date End Date Taking? Authorizing Provider  allopurinol (ZYLOPRIM) 300 MG tablet Take 1 tablet (300 mg total) by mouth daily. 01/31/16  Yes Roderick PeeJeffrey A Todd, MD  ibuprofen (ADVIL,MOTRIN) 200 MG tablet Take 600 mg by mouth every 6 (six) hours as needed.    Yes Historical Provider, MD    Family History Family History  Problem Relation Age of Onset  . CAD Mother     CABG in her 4250s  . Stroke Mother   . Diabetes Mother   . Prostate cancer Father     dx in his 4370s  . Colon cancer Neg Hx     Social History Social History    Substance Use Topics  . Smoking status: Never Smoker  . Smokeless tobacco: Never Used  . Alcohol use No     Allergies   Bee venom; Peanut oil; and Shellfish-derived products   Review of Systems Review of Systems  All other systems reviewed and are negative.    Physical Exam Updated Vital Signs BP 141/99   Pulse 82   Temp 98.8 F (37.1 C) (Oral)   Resp 15   Ht 5\' 11"  (1.803 m)   Wt 278 lb (126.1 kg)   SpO2 97%   BMI 38.77 kg/m   Physical Exam  Constitutional: He is oriented to person, place, and time. He appears well-developed and well-nourished.  HENT:  Head: Normocephalic and atraumatic.  Eyes: EOM are normal. Pupils are equal, round, and reactive to light.  Neck: Normal range of motion.  Cardiovascular: Normal rate, regular rhythm, normal heart sounds and intact distal pulses.   Pulmonary/Chest: Effort normal and breath sounds normal. No respiratory distress.  Abdominal: Soft. He exhibits no distension. There is no tenderness.  Musculoskeletal: Normal range of motion.  Neurological: He is alert and oriented to person, place, and time.  5/5 strength in major muscle groups of  bilateral upper and lower extremities. Speech normal. No facial asymetry.   Skin: Skin is warm  and dry.  Psychiatric: He has a normal mood and affect. Judgment normal.  Nursing note and vitals reviewed.    ED Treatments / Results  Labs (all labs ordered are listed, but only abnormal results are displayed) Labs Reviewed  COMPREHENSIVE METABOLIC PANEL - Abnormal; Notable for the following:       Result Value   Calcium 8.8 (*)    All other components within normal limits  URINALYSIS, ROUTINE W REFLEX MICROSCOPIC  CBC WITH DIFFERENTIAL/PLATELET  CBG MONITORING, ED    EKG  EKG Interpretation None       Radiology Ct Head Wo Contrast  Result Date: 12/08/2016 CLINICAL DATA:  Patient is complaining of a headache. Patient states he has never felt a headache like this before.  Patient states it happened around 5:23pm. Patient states EMS came and his blood pressure was high EXAM: CT HEAD WITHOUT CONTRAST TECHNIQUE: Contiguous axial images were obtained from the base of the skull through the vertex without intravenous contrast. COMPARISON:  None. FINDINGS: Brain: No evidence of acute infarction, hemorrhage, hydrocephalus, extra-axial collection or mass lesion/mass effect. Vascular: No hyperdense vessel or unexpected calcification. Skull: Normal. Negative for fracture or focal lesion. Sinuses/Orbits: Globes orbits are unremarkable. Visualized sinuses and mastoid air cells are clear. Other: None. IMPRESSION: Normal unenhanced CT scan of the brain. Electronically Signed   By: Amie Portland M.D.   On: 12/08/2016 23:46    Procedures Procedures (including critical care time)  Medications Ordered in ED Medications - No data to display   Initial Impression / Assessment and Plan / ED Course  I have reviewed the triage vital signs and the nursing notes.  Pertinent labs & imaging results that were available during my care of the patient were reviewed by me and considered in my medical decision making (see chart for details).     Well-appearing.  Headache resolved.  Head CT negative.  Doubt subarachnoid hemorrhage.  Primary care follow-up.  Ambulatory.  Normal neuro exam.  Final Clinical Impressions(s) / ED Diagnoses   Final diagnoses:  Acute nonintractable headache, unspecified headache type    New Prescriptions Discharge Medication List as of 12/08/2016 11:53 PM       Azalia Bilis, MD 12/09/16 [phone]

## 2017-02-14 ENCOUNTER — Other Ambulatory Visit: Payer: Self-pay | Admitting: Family Medicine

## 2017-04-25 ENCOUNTER — Telehealth: Payer: Self-pay | Admitting: *Deleted

## 2017-04-25 NOTE — Telephone Encounter (Signed)
Received request for Medical Records from [email or web address] c/o [facility] [employer], forwarded to [place] for email/scana/SLS 07/12

## 2017-05-24 DIAGNOSIS — M79675 Pain in left toe(s): Secondary | ICD-10-CM | POA: Diagnosis not present

## 2017-05-24 DIAGNOSIS — M109 Gout, unspecified: Secondary | ICD-10-CM | POA: Diagnosis not present

## 2017-06-06 ENCOUNTER — Telehealth: Payer: Self-pay | Admitting: Internal Medicine

## 2017-06-06 NOTE — Telephone Encounter (Signed)
Pt's wife dropped off document to be filled out by Provider ([facility] [place] [facility] [employer] [facility]- 2 pages) Pt would like to have document faxed to [phone] and to call pt to inform document has been faxed. Document put at front office tray.

## 2017-06-10 NOTE — Telephone Encounter (Signed)
Spoke with Ian Wilson to inform her that provider is out of the office this week; understood and agreed to let Social Services [[facility]] know, states not a problem/SLS 08/27

## 2017-06-18 ENCOUNTER — Ambulatory Visit (INDEPENDENT_AMBULATORY_CARE_PROVIDER_SITE_OTHER): Payer: Commercial Managed Care - PPO

## 2017-06-18 ENCOUNTER — Ambulatory Visit (INDEPENDENT_AMBULATORY_CARE_PROVIDER_SITE_OTHER): Payer: Commercial Managed Care - PPO | Admitting: Orthopaedic Surgery

## 2017-06-18 ENCOUNTER — Ambulatory Visit (INDEPENDENT_AMBULATORY_CARE_PROVIDER_SITE_OTHER): Payer: Self-pay

## 2017-06-18 ENCOUNTER — Ambulatory Visit (INDEPENDENT_AMBULATORY_CARE_PROVIDER_SITE_OTHER): Payer: PRIVATE HEALTH INSURANCE | Admitting: Orthopaedic Surgery

## 2017-06-18 ENCOUNTER — Encounter (INDEPENDENT_AMBULATORY_CARE_PROVIDER_SITE_OTHER): Payer: Self-pay | Admitting: Orthopaedic Surgery

## 2017-06-18 DIAGNOSIS — M25562 Pain in left knee: Secondary | ICD-10-CM | POA: Diagnosis not present

## 2017-06-18 DIAGNOSIS — M25561 Pain in right knee: Secondary | ICD-10-CM

## 2017-06-18 DIAGNOSIS — G8929 Other chronic pain: Secondary | ICD-10-CM | POA: Diagnosis not present

## 2017-06-18 NOTE — Progress Notes (Signed)
Office Visit Note   Patient: Ian Wilson           Date of Birth: 12/25/1960           MRN: [id number] Visit Date: 06/18/2017              Requested by: Wanda Plump, MD [address] [place], [place] [address] PCP: Wanda Plump, MD   Assessment & Plan: Visit Diagnoses:  1. Chronic pain of both knees     Plan: Patient has advanced degenerative joint disease of both knees worse than his left with varus deformity. Patient denies any changes in medical history. He wishes to undergo left total knee replacement in January 2019. We will schedule him for surgery around that time. Questions encouraged and answered regarding the details of surgery.  Follow-Up Instructions: Return if symptoms worsen or fail to improve.   Orders:  Orders Placed This Encounter  Procedures  . XR KNEE 3 VIEW RIGHT  . XR KNEE 3 VIEW LEFT   No orders of the defined types were placed in this encounter.     Procedures: No procedures performed   Clinical Data: No additional findings.   Subjective: Chief Complaint  Patient presents with  . Right Knee - Pain  . Left Knee - Pain    Patient follows up today for bilateral knee degenerative joint disease. He would like to schedule surgery for January. He denies any changes in medical history. His left knee is significantly affecting his quality of life and ADLs.  He does have a history of gout flareups but these are well controlled.    Review of Systems  Constitutional: Negative.   All other systems reviewed and are negative.    Objective: Vital Signs: There were no vitals taken for this visit.  Physical Exam  Constitutional: He is oriented to person, place, and time. He appears well-developed and well-nourished.  Pulmonary/Chest: Effort normal.  Abdominal: Soft.  Neurological: He is alert and oriented to person, place, and time.  Skin: Skin is warm.  Psychiatric: He has a normal mood and affect. His behavior is normal.  Judgment and thought content normal.  Nursing note and vitals reviewed.   Ortho Exam Left knee exam shows a flexion contracture of 15-20. He has a fixed varus deformity. No joint effusion. Specialty Comments:  No specialty comments available.  Imaging: Xr Knee 3 View Left  Result Date: 06/18/2017 Severe advanced degenerative joint disease with varus deformity  Xr Knee 3 View Right  Result Date: 06/18/2017 Advanced degenerative joint disease with varus deformity    PMFS History: Patient Active Problem List   Diagnosis Date Noted  . PCP NOTES >>>>>>>>>>>>>>>>> 06/19/2016  . Diverticulitis of intestine without perforation or abscess without bleeding 08/02/2015  . Ankle pain 12/16/2014  . Diarrhea, Intermittent, lactose intolerance ? 04/13/2014  . Annual physical exam 12/25/2013  . DJD (degenerative joint disease) 11/18/2013  . Gout 10/29/2012  . HTN (hypertension)    Past Medical History:  Diagnosis Date  . Gout   . HTN (hypertension)     Family History  Problem Relation Age of Onset  . CAD Mother        CABG in her 35s  . Stroke Mother   . Diabetes Mother   . Prostate cancer Father        dx in his 1s  . Colon cancer Neg Hx     Past Surgical History:  Procedure Laterality Date  .  NO PAST SURGERIES     Social History   Occupational History  . sales      Social History Main Topics  . Smoking status: Never Smoker  . Smokeless tobacco: Never Used  . Alcohol use No  . Drug use: No  . Sexual activity: Not on file

## 2017-06-18 NOTE — Telephone Encounter (Signed)
Completed Form faxed to [phone], spoke w/ Leta JunglingMarcia, informed that form has been faxed and original placed at front desk for pick up at her convenience. Copy of form sent for scanning.

## 2017-06-25 ENCOUNTER — Ambulatory Visit (INDEPENDENT_AMBULATORY_CARE_PROVIDER_SITE_OTHER): Payer: Commercial Managed Care - PPO | Admitting: Internal Medicine

## 2017-06-25 ENCOUNTER — Encounter: Payer: Self-pay | Admitting: Internal Medicine

## 2017-06-25 VITALS — BP 128/80 | HR 84 | Temp 98.1°F | Resp 14 | Ht 71.0 in | Wt 282.0 lb

## 2017-06-25 DIAGNOSIS — M1000 Idiopathic gout, unspecified site: Secondary | ICD-10-CM | POA: Diagnosis not present

## 2017-06-25 DIAGNOSIS — Z0000 Encounter for general adult medical examination without abnormal findings: Secondary | ICD-10-CM

## 2017-06-25 DIAGNOSIS — Z1159 Encounter for screening for other viral diseases: Secondary | ICD-10-CM

## 2017-06-25 LAB — LIPID PANEL
CHOL/HDL RATIO: 6
Cholesterol: 204 mg/dL — ABNORMAL HIGH (ref 0–200)
HDL: 34.6 mg/dL — ABNORMAL LOW (ref >39.00)
LDL Cholesterol: 154 mg/dL — ABNORMAL HIGH (ref 0–99)
NONHDL: 169.49
Triglycerides: 77 mg/dL (ref 0.0–149.0)
VLDL: 15.4 mg/dL (ref 0.0–40.0)

## 2017-06-25 LAB — CBC WITH DIFFERENTIAL/PLATELET
BASOS ABS: 0 10*3/uL (ref 0.0–0.1)
Basophils Relative: 0.6 % (ref 0.0–3.0)
Eosinophils Absolute: 0.3 10*3/uL (ref 0.0–0.7)
Eosinophils Relative: 5.5 % — ABNORMAL HIGH (ref 0.0–5.0)
HCT: 48.1 % (ref 39.0–52.0)
Hemoglobin: 15.8 g/dL (ref 13.0–17.0)
LYMPHS ABS: 1.8 10*3/uL (ref 0.7–4.0)
LYMPHS PCT: 34.6 % (ref 12.0–46.0)
MCHC: 32.8 g/dL (ref 30.0–36.0)
MCV: 93.5 fl (ref 78.0–100.0)
MONOS PCT: 8.1 % (ref 3.0–12.0)
Monocytes Absolute: 0.4 10*3/uL (ref 0.1–1.0)
NEUTROS PCT: 51.2 % (ref 43.0–77.0)
Neutro Abs: 2.6 10*3/uL (ref 1.4–7.7)
Platelets: 240 10*3/uL (ref 150.0–400.0)
RBC: 5.15 Mil/uL (ref 4.22–5.81)
RDW: 13.7 % (ref 11.5–15.5)
WBC: 5.1 10*3/uL (ref 4.0–10.5)

## 2017-06-25 LAB — TSH: TSH: 1.4 u[IU]/mL (ref 0.35–4.50)

## 2017-06-25 LAB — COMPREHENSIVE METABOLIC PANEL
ALT: 18 U/L (ref 0–53)
AST: 16 U/L (ref 0–37)
Albumin: 4.2 g/dL (ref 3.5–5.2)
Alkaline Phosphatase: 64 U/L (ref 39–117)
BILIRUBIN TOTAL: 0.7 mg/dL (ref 0.2–1.2)
BUN: 17 mg/dL (ref 6–23)
CO2: 28 meq/L (ref 19–32)
CREATININE: 1.12 mg/dL (ref 0.40–1.50)
Calcium: 9.3 mg/dL (ref 8.4–10.5)
Chloride: 104 mEq/L (ref 96–112)
GFR: 87.54 mL/min (ref >60.00)
GLUCOSE: 104 mg/dL — AB (ref 70–99)
Potassium: 4.3 mEq/L (ref 3.5–5.1)
Sodium: 139 mEq/L (ref 135–145)
Total Protein: 7.1 g/dL (ref 6.0–8.3)

## 2017-06-25 LAB — URIC ACID: Uric Acid, Serum: 5.5 mg/dL (ref 4.0–7.8)

## 2017-06-25 NOTE — Progress Notes (Signed)
Subjective:    Patient ID: Ian Wilson, male    DOB: [birth year], 56 y.o.   MRN: [id number]  DOS:  06/25/2017 Type of visit - description : cpx Interval history: No new concerns. Had a gout flareup last month in the context of running out of allopurinol for one week. Symptoms resolved, he is back on medications   Review of Systems Continue with bilateral knee pain, worse on the left.   Other than above, a 14 point review of systems is negative      Past Medical History:  Diagnosis Date  . Gout   . HTN (hypertension)     Past Surgical History:  Procedure Laterality Date  . NO PAST SURGERIES      Social History   Social History  . Marital status: Married    Spouse name: N/A  . Number of children: 1  . Years of education: N/A   Occupational History  . sales      Social History Main Topics  . Smoking status: Never Smoker  . Smokeless tobacco: Never Used  . Alcohol use No  . Drug use: No  . Sexual activity: Not on file   Other Topics Concern  . Not on file   Social History Narrative   Lives w/ wife      Family History  Problem Relation Age of Onset  . CAD Mother        CABG in her 14s  . Stroke Mother   . Diabetes Mother   . Prostate cancer Father        dx in his 6s  . Colon cancer Neg Hx      Allergies as of 06/25/2017      Reactions   Bee Venom    Other reaction(s): ANAPHYLAXIS   Peanut Oil    Other reaction(s): ANAPHYLAXIS   Shellfish-derived Products    Other reaction(s): ANAPHYLAXIS      Medication List       Accurate as of 06/25/17 10:29 AM. Always use your most recent med list.          allopurinol 300 MG tablet Commonly known as:  ZYLOPRIM Take 1 tablet (300 mg total) by mouth daily.   ibuprofen 200 MG tablet Commonly known as:  ADVIL,MOTRIN Take 600 mg by mouth every 6 (six) hours as needed.          Objective:   Physical Exam BP 128/80 (BP Location: Left Arm, Patient Position: Sitting, Cuff Size: Normal)    Pulse 84   Temp 98.1 F (36.7 C) (Oral)   Resp 14   Ht  (1.803 m)   Wt 282 lb (127.9 kg)   SpO2 98%   BMI 39.33 kg/m   General:   Well developed, well nourished . NAD.  Neck: No  thyromegaly  HEENT:  Normocephalic . Face symmetric, atraumatic Lungs:  CTA B Normal respiratory effort, no intercostal retractions, no accessory muscle use. Heart: RRR,  no murmur.  No pretibial edema bilaterally  Abdomen:  Not distended, soft, non-tender. No rebound or rigidity.   Skin: Exposed areas without rash. Not pale. Not jaundice Neurologic:  alert & oriented X3.  Speech normal, gait   unassisted and somewhat limited by knee DJD Strength symmetric and appropriate for age.  Psych: Cognition and judgment appear intact.  Cooperative with normal attention span and concentration.  Behavior appropriate. No anxious or depressed appearing.    Assessment & Plan:   Assessment HTN Gout DJD, knees  H/o diverticulitis 2016 Allergies: peanut oil, shellfish On EpiPen  PLAN: HTN: On no meds, BP is okay Gout: Well-controlled except for the time that he ran out of allopurinol for one week. Checking labs DJD: planning a  left TKR January 2019. Currently handling pain with OTC prn RTC one year

## 2017-06-25 NOTE — Progress Notes (Signed)
Pre visit review using our clinic review tool, if applicable. No additional management support is needed unless otherwise documented below in the visit note. 

## 2017-06-25 NOTE — Assessment & Plan Note (Addendum)
-  Tdap: 2009; rec a flu shot this fall --CCS: Had a cscope [employer]([place])   ~ 2012, pt was told normal , no report available  -prostate ca screening : DRE PSA 2017 wnl - (+) FH heart disease mother at age 55 and  (+) FH father prostate cancer at age 55.  - Extensive discussion about diet, weight loss. Weight loss definitely will help recovery after planned TKR. Calorie counting strongly encouraged -Labs: CMP, FLP, CBC, TSH, uric acid, hep C

## 2017-06-25 NOTE — Patient Instructions (Signed)
GO TO THE LAB : Get the blood work     GO TO THE FRONT DESK Schedule your next appointment for a physical in 1 year  Consider calorie counting with an app MYFITNESSPAL

## 2017-06-26 LAB — HEPATITIS C ANTIBODY
HEP C AB: NONREACTIVE
SIGNAL TO CUT-OFF: 0.03 (ref <1.00)

## 2017-06-26 NOTE — Assessment & Plan Note (Signed)
HTN: On no meds, BP is okay Gout: Well-controlled except for the time that he ran out of allopurinol for one week. Checking labs DJD: planning a  left TKR January 2019. Currently handling pain with OTC prn RTC one year

## 2017-08-19 ENCOUNTER — Other Ambulatory Visit: Payer: Self-pay | Admitting: Family Medicine

## 2017-08-19 NOTE — Telephone Encounter (Signed)
This request came to dr Tawanna Coolerodd by error.Please Advise

## 2017-09-17 ENCOUNTER — Telehealth (INDEPENDENT_AMBULATORY_CARE_PROVIDER_SITE_OTHER): Payer: Self-pay | Admitting: Orthopaedic Surgery

## 2017-09-17 NOTE — Telephone Encounter (Signed)
Pt wife called to get her husband surgery sched. Pt wanted to have surgery in jan.

## 2017-09-19 DIAGNOSIS — H5213 Myopia, bilateral: Secondary | ICD-10-CM | POA: Diagnosis not present

## 2017-09-19 DIAGNOSIS — H524 Presbyopia: Secondary | ICD-10-CM | POA: Diagnosis not present

## 2017-10-31 NOTE — Telephone Encounter (Signed)
I called and left voice mail for return call to schedule. 

## 2017-11-05 NOTE — Telephone Encounter (Signed)
Patient has been scheduled for surgery 11/21/17.

## 2017-11-13 ENCOUNTER — Encounter (HOSPITAL_COMMUNITY): Payer: Self-pay

## 2017-11-13 ENCOUNTER — Encounter (HOSPITAL_COMMUNITY)
Admission: RE | Admit: 2017-11-13 | Discharge: 2017-11-13 | Disposition: A | Discharge status: Home / Self Care | Payer: Commercial Managed Care - PPO | Source: Ambulatory Visit | Attending: Orthopaedic Surgery | Admitting: Orthopaedic Surgery

## 2017-11-13 ENCOUNTER — Ambulatory Visit (HOSPITAL_COMMUNITY)
Admission: RE | Admit: 2017-11-13 | Discharge: 2017-11-13 | Disposition: A | Discharge status: Home / Self Care | Payer: Commercial Managed Care - PPO | Source: Ambulatory Visit | Attending: Physician Assistant | Admitting: Physician Assistant

## 2017-11-13 ENCOUNTER — Other Ambulatory Visit: Payer: Self-pay

## 2017-11-13 DIAGNOSIS — M1712 Unilateral primary osteoarthritis, left knee: Secondary | ICD-10-CM | POA: Insufficient documentation

## 2017-11-13 DIAGNOSIS — Z01818 Encounter for other preprocedural examination: Secondary | ICD-10-CM | POA: Diagnosis not present

## 2017-11-13 DIAGNOSIS — M109 Gout, unspecified: Secondary | ICD-10-CM | POA: Diagnosis not present

## 2017-11-13 DIAGNOSIS — R918 Other nonspecific abnormal finding of lung field: Secondary | ICD-10-CM | POA: Diagnosis not present

## 2017-11-13 DIAGNOSIS — M25571 Pain in right ankle and joints of right foot: Secondary | ICD-10-CM | POA: Diagnosis not present

## 2017-11-13 HISTORY — DX: Presence of spectacles and contact lenses: Z97.3

## 2017-11-13 HISTORY — DX: Headache: R51

## 2017-11-13 HISTORY — DX: Diverticulosis of intestine, part unspecified, without perforation or abscess without bleeding: K57.90

## 2017-11-13 HISTORY — DX: Obesity, unspecified: E66.9

## 2017-11-13 HISTORY — DX: Unspecified osteoarthritis, unspecified site: M19.90

## 2017-11-13 HISTORY — DX: Headache, unspecified: R51.9

## 2017-11-13 LAB — COMPREHENSIVE METABOLIC PANEL
ALBUMIN: 3.8 g/dL (ref 3.5–5.0)
ALT: 18 U/L (ref 17–63)
AST: 19 U/L (ref 15–41)
Alkaline Phosphatase: 72 U/L (ref 38–126)
Anion gap: 10 (ref 5–15)
BILIRUBIN TOTAL: 0.6 mg/dL (ref 0.3–1.2)
BUN: 13 mg/dL (ref 6–20)
CHLORIDE: 105 mmol/L (ref 101–111)
CO2: 25 mmol/L (ref 22–32)
Calcium: 9 mg/dL (ref 8.9–10.3)
Creatinine, Ser: 1.1 mg/dL (ref 0.61–1.24)
GFR calc Af Amer: >60 mL/min (ref >60)
GFR calc non Af Amer: >60 mL/min (ref >60)
GLUCOSE: 83 mg/dL (ref 65–99)
Potassium: 4.3 mmol/L (ref 3.5–5.1)
Sodium: 140 mmol/L (ref 135–145)
Total Protein: 7 g/dL (ref 6.5–8.1)

## 2017-11-13 LAB — CBC WITH DIFFERENTIAL/PLATELET
BASOS ABS: 0 10*3/uL (ref 0.0–0.1)
BASOS PCT: 0 %
Eosinophils Absolute: 0.1 10*3/uL (ref 0.0–0.7)
Eosinophils Relative: 1 %
HEMATOCRIT: 48.8 % (ref 39.0–52.0)
Hemoglobin: 16.2 g/dL (ref 13.0–17.0)
Lymphocytes Relative: 24 %
Lymphs Abs: 2.1 10*3/uL (ref 0.7–4.0)
MCH: 31.4 pg (ref 26.0–34.0)
MCHC: 33.2 g/dL (ref 30.0–36.0)
MCV: 94.6 fL (ref 78.0–100.0)
Monocytes Absolute: 0.6 10*3/uL (ref 0.1–1.0)
Monocytes Relative: 6 %
NEUTROS ABS: 6.3 10*3/uL (ref 1.7–7.7)
Neutrophils Relative %: 69 %
PLATELETS: 231 10*3/uL (ref 150–400)
RBC: 5.16 MIL/uL (ref 4.22–5.81)
RDW: 13.9 % (ref 11.5–15.5)
WBC: 9.1 10*3/uL (ref 4.0–10.5)

## 2017-11-13 LAB — PROTIME-INR
INR: 0.95
Prothrombin Time: 12.6 seconds (ref 11.4–15.2)

## 2017-11-13 LAB — TYPE AND SCREEN
ABO/RH(D): B POS
Antibody Screen: NEGATIVE

## 2017-11-13 LAB — ABO/RH: ABO/RH(D): B POS

## 2017-11-13 LAB — APTT: APTT: 32 s (ref 24–36)

## 2017-11-13 LAB — SURGICAL PCR SCREEN
MRSA, PCR: NEGATIVE
STAPHYLOCOCCUS AUREUS: NEGATIVE

## 2017-11-13 NOTE — Progress Notes (Signed)
   11/13/17 1456  OBSTRUCTIVE SLEEP APNEA  Have you ever been diagnosed with sleep apnea through a sleep study? No  If yes, do you have and use a CPAP or BPAP machine every night? 0  Do you snore loudly (loud enough to be heard through closed doors)?  0  Do you often feel tired, fatigued, or sleepy during the daytime (such as falling asleep during driving or talking to someone)? 0  Has anyone observed you stop breathing during your sleep? 1  Do you have, or are you being treated for high blood pressure? 0  BMI more than 35 kg/m2? 1  Age > 50 (1-yes) 1  Neck circumference greater than:Male 16 inches or larger, Male 17inches or larger? 1  Male Gender (Yes=1) 1  Obstructive Sleep Apnea Score 5

## 2017-11-13 NOTE — Pre-Procedure Instructions (Signed)
    Ian SiresGeorge D Wilson  11/13/2017      [facility] [address] [place] [place] [address] Phone: [phone] Fax: [phone]    Your procedure is scheduled on Thursday, November 21, 2017  [facility] Admitting at 10:15 A.M.  Call this number if you have problems the morning of surgery:  [phone]   Remember:  Do not eat food or drink liquids after midnight Wednesday, November 20, 2017  Take these medicines the morning of surgery with A SIP OF WATER : allopurinol (ZYLOPRIM),  colchicine .  Stop taking Aspirin, vitamins, fish oil and herbal medications. Do not take any NSAIDs ie: Ibuprofen, Advil, Naproxen (Aleve), Motrin, BC and Goody Powder; stop Thursday, Nov 14, 2017  Do not wear jewelry, make-up or nail polish.  Do not wear lotions, powders, or perfumes, or deodorant.  Do not shave 48 hours prior to surgery.  Men may shave face and neck.  Do not bring valuables to the hospital.  [facility] is not responsible for any belongings or valuables.  Contacts, dentures or bridgework may not be worn into surgery.  Leave your suitcase in the car.  After surgery it may be brought to your room. For patients admitted to the hospital, discharge time will be determined by your treatment team. Special instructions: Shower the night before surgery and the morning of surgery with CHG.  Please read over the following fact sheets that you were given. Pain Booklet, Coughing and Deep Breathing, Blood Transfusion Information, Total Joint Packet, MRSA Information and Surgical Site Infection Prevention

## 2017-11-13 NOTE — Progress Notes (Signed)
Pt denies SOB, chest pain, and being under the care of a cardiologist. Pt denies having a stress test, echo and cardiac cath. Pt denies having a chest x ray within the last year. Pt denies recent labs. Planned to repeat pt vital signs however, pt left before doing so. Pt was clearly in pain after a visit to the doctors for an exacerbation of gout in the right ankle today. .Marland Kitchen

## 2017-11-14 ENCOUNTER — Other Ambulatory Visit (INDEPENDENT_AMBULATORY_CARE_PROVIDER_SITE_OTHER): Payer: Self-pay

## 2017-11-21 ENCOUNTER — Encounter (HOSPITAL_COMMUNITY)
Admission: RE | Disposition: A | Discharge status: Home Health | Payer: Self-pay | Source: Ambulatory Visit | Attending: Orthopaedic Surgery

## 2017-11-21 ENCOUNTER — Inpatient Hospital Stay (HOSPITAL_COMMUNITY): Payer: Commercial Managed Care - PPO | Admitting: Emergency Medicine

## 2017-11-21 ENCOUNTER — Inpatient Hospital Stay (HOSPITAL_COMMUNITY): Payer: Commercial Managed Care - PPO | Admitting: Certified Registered Nurse Anesthetist

## 2017-11-21 ENCOUNTER — Inpatient Hospital Stay (HOSPITAL_COMMUNITY): Payer: Commercial Managed Care - PPO

## 2017-11-21 ENCOUNTER — Inpatient Hospital Stay (HOSPITAL_COMMUNITY)
Admission: RE | Admit: 2017-11-21 | Discharge: 2017-11-24 | DRG: 470 | Disposition: A | Discharge status: Home Health | Payer: Commercial Managed Care - PPO | Source: Ambulatory Visit | Attending: Orthopaedic Surgery | Admitting: Orthopaedic Surgery

## 2017-11-21 ENCOUNTER — Encounter (HOSPITAL_COMMUNITY): Payer: Self-pay | Admitting: *Deleted

## 2017-11-21 DIAGNOSIS — G8918 Other acute postprocedural pain: Secondary | ICD-10-CM | POA: Diagnosis not present

## 2017-11-21 DIAGNOSIS — K5792 Diverticulitis of intestine, part unspecified, without perforation or abscess without bleeding: Secondary | ICD-10-CM | POA: Diagnosis not present

## 2017-11-21 DIAGNOSIS — M109 Gout, unspecified: Secondary | ICD-10-CM | POA: Diagnosis present

## 2017-11-21 DIAGNOSIS — Z91013 Allergy to seafood: Secondary | ICD-10-CM | POA: Diagnosis not present

## 2017-11-21 DIAGNOSIS — M25762 Osteophyte, left knee: Secondary | ICD-10-CM | POA: Diagnosis not present

## 2017-11-21 DIAGNOSIS — I10 Essential (primary) hypertension: Secondary | ICD-10-CM | POA: Diagnosis not present

## 2017-11-21 DIAGNOSIS — Z8249 Family history of ischemic heart disease and other diseases of the circulatory system: Secondary | ICD-10-CM

## 2017-11-21 DIAGNOSIS — D62 Acute posthemorrhagic anemia: Secondary | ICD-10-CM | POA: Diagnosis not present

## 2017-11-21 DIAGNOSIS — Z79899 Other long term (current) drug therapy: Secondary | ICD-10-CM

## 2017-11-21 DIAGNOSIS — Z823 Family history of stroke: Secondary | ICD-10-CM | POA: Diagnosis not present

## 2017-11-21 DIAGNOSIS — Z96659 Presence of unspecified artificial knee joint: Secondary | ICD-10-CM

## 2017-11-21 DIAGNOSIS — M170 Bilateral primary osteoarthritis of knee: Secondary | ICD-10-CM | POA: Diagnosis not present

## 2017-11-21 DIAGNOSIS — Z471 Aftercare following joint replacement surgery: Secondary | ICD-10-CM | POA: Diagnosis not present

## 2017-11-21 DIAGNOSIS — Z91010 Allergy to peanuts: Secondary | ICD-10-CM

## 2017-11-21 DIAGNOSIS — Z973 Presence of spectacles and contact lenses: Secondary | ICD-10-CM | POA: Diagnosis not present

## 2017-11-21 DIAGNOSIS — M1712 Unilateral primary osteoarthritis, left knee: Secondary | ICD-10-CM | POA: Diagnosis not present

## 2017-11-21 DIAGNOSIS — Z91030 Bee allergy status: Secondary | ICD-10-CM | POA: Diagnosis not present

## 2017-11-21 DIAGNOSIS — Z96652 Presence of left artificial knee joint: Secondary | ICD-10-CM

## 2017-11-21 HISTORY — PX: TOTAL KNEE ARTHROPLASTY: SHX125

## 2017-11-21 SURGERY — ARTHROPLASTY, KNEE, TOTAL
Anesthesia: Regional | Site: Knee | Laterality: Left

## 2017-11-21 MED ORDER — CHLORHEXIDINE GLUCONATE 4 % EX LIQD: 60.0000 mL | Freq: Once | CUTANEOUS | Status: DC

## 2017-11-21 MED ORDER — FENTANYL CITRATE (PF) 250 MCG/5ML IJ SOLN
INTRAMUSCULAR | Status: AC
  Filled 2017-11-21: qty 5

## 2017-11-21 MED ORDER — TRANEXAMIC ACID 1000 MG/10ML IV SOLN
2000.0000 mg | INTRAVENOUS | Status: DC
  Filled 2017-11-21 (×2): qty 20

## 2017-11-21 MED ORDER — ASPIRIN EC 325 MG PO TBEC: 325.0000 mg | DELAYED_RELEASE_TABLET | Freq: Two times a day (BID) | ORAL | 0 refills | Status: DC

## 2017-11-21 MED ORDER — ACETAMINOPHEN 650 MG RE SUPP: 650.0000 mg | RECTAL | Status: DC | PRN

## 2017-11-21 MED ORDER — FENTANYL CITRATE (PF) 100 MCG/2ML IJ SOLN
50.0000 ug | INTRAMUSCULAR | Status: DC | PRN
  Administered 2017-11-21: 100 ug via INTRAVENOUS

## 2017-11-21 MED ORDER — SODIUM CHLORIDE 0.9 % IV SOLN
INTRAVENOUS | Status: DC
  Administered 2017-11-21: 18:00:00 via INTRAVENOUS

## 2017-11-21 MED ORDER — CEFAZOLIN SODIUM-DEXTROSE 2-4 GM/100ML-% IV SOLN
2.0000 g | Freq: Three times a day (TID) | INTRAVENOUS | Status: AC
  Administered 2017-11-21 – 2017-11-22 (×3): 2 g via INTRAVENOUS
  Filled 2017-11-21 (×3): qty 100

## 2017-11-21 MED ORDER — PROPOFOL 10 MG/ML IV BOLUS
INTRAVENOUS | Status: DC | PRN
  Administered 2017-11-21: 20 mg via INTRAVENOUS

## 2017-11-21 MED ORDER — ONDANSETRON HCL 4 MG PO TABS: 4.0000 mg | ORAL_TABLET | Freq: Three times a day (TID) | ORAL | 0 refills | Status: DC | PRN

## 2017-11-21 MED ORDER — HYDROMORPHONE HCL 1 MG/ML IJ SOLN: 0.2500 mg | INTRAMUSCULAR | Status: DC | PRN

## 2017-11-21 MED ORDER — METHOCARBAMOL 1000 MG/10ML IJ SOLN: 500.0000 mg | Freq: Four times a day (QID) | INTRAVENOUS | Status: DC | PRN

## 2017-11-21 MED ORDER — TRANEXAMIC ACID 1000 MG/10ML IV SOLN
INTRAVENOUS | Status: AC | PRN
  Administered 2017-11-21: 2000 mg via TOPICAL

## 2017-11-21 MED ORDER — LACTATED RINGERS IV SOLN
INTRAVENOUS | Status: DC | PRN
  Administered 2017-11-21 (×2): via INTRAVENOUS

## 2017-11-21 MED ORDER — OXYCODONE HCL ER 15 MG PO T12A
15.0000 mg | EXTENDED_RELEASE_TABLET | Freq: Two times a day (BID) | ORAL | Status: DC
  Administered 2017-11-21 – 2017-11-24 (×6): 15 mg via ORAL
  Filled 2017-11-21 (×6): qty 1

## 2017-11-21 MED ORDER — DIPHENHYDRAMINE HCL 12.5 MG/5ML PO ELIX: 25.0000 mg | ORAL_SOLUTION | ORAL | Status: DC | PRN

## 2017-11-21 MED ORDER — ONDANSETRON HCL 4 MG/2ML IJ SOLN: 4.0000 mg | Freq: Four times a day (QID) | INTRAMUSCULAR | Status: DC | PRN

## 2017-11-21 MED ORDER — POLYETHYLENE GLYCOL 3350 17 G PO PACK
17.0000 g | PACK | Freq: Every day | ORAL | Status: DC | PRN
Start: 2017-11-21 — End: 2017-11-24

## 2017-11-21 MED ORDER — MORPHINE SULFATE (PF) 2 MG/ML IV SOLN: 1.0000 mg | INTRAVENOUS | Status: DC | PRN

## 2017-11-21 MED ORDER — FENTANYL CITRATE (PF) 100 MCG/2ML IJ SOLN
INTRAMUSCULAR | Status: DC | PRN
  Administered 2017-11-21 (×4): 25 ug via INTRAVENOUS

## 2017-11-21 MED ORDER — BUPIVACAINE LIPOSOME 1.3 % IJ SUSP
20.0000 mL | Freq: Once | INTRAMUSCULAR | Status: AC
  Filled 2017-11-21: qty 20

## 2017-11-21 MED ORDER — DEXAMETHASONE SODIUM PHOSPHATE 10 MG/ML IJ SOLN
10.0000 mg | Freq: Once | INTRAMUSCULAR | Status: AC
  Administered 2017-11-22: 10 mg via INTRAVENOUS
  Filled 2017-11-21: qty 1

## 2017-11-21 MED ORDER — MAGNESIUM CITRATE PO SOLN: 1.0000 | Freq: Once | ORAL | Status: DC | PRN

## 2017-11-21 MED ORDER — PHENOL 1.4 % MT LIQD
1.0000 | OROMUCOSAL | Status: DC | PRN
Start: 2017-11-21 — End: 2017-11-24

## 2017-11-21 MED ORDER — SODIUM CHLORIDE 0.9 % IR SOLN
Status: DC | PRN
  Administered 2017-11-21 (×2): 3000 mL

## 2017-11-21 MED ORDER — KETOROLAC TROMETHAMINE 15 MG/ML IJ SOLN
30.0000 mg | Freq: Four times a day (QID) | INTRAMUSCULAR | Status: AC
  Administered 2017-11-21 – 2017-11-22 (×4): 30 mg via INTRAVENOUS
  Filled 2017-11-21 (×4): qty 2

## 2017-11-21 MED ORDER — OXYCODONE HCL 5 MG PO TABS: 5.0000 mg | ORAL_TABLET | ORAL | 0 refills | Status: DC | PRN

## 2017-11-21 MED ORDER — METHOCARBAMOL 750 MG PO TABS: 750.0000 mg | ORAL_TABLET | Freq: Two times a day (BID) | ORAL | 0 refills | Status: DC | PRN

## 2017-11-21 MED ORDER — OXYCODONE HCL 5 MG PO TABS
5.0000 mg | ORAL_TABLET | ORAL | Status: DC | PRN
  Administered 2017-11-23 (×2): 5 mg via ORAL
  Filled 2017-11-21 (×2): qty 1

## 2017-11-21 MED ORDER — PROMETHAZINE HCL 25 MG/ML IJ SOLN: 6.2500 mg | INTRAMUSCULAR | Status: DC | PRN

## 2017-11-21 MED ORDER — OXYCODONE HCL 5 MG PO TABS
10.0000 mg | ORAL_TABLET | ORAL | Status: DC | PRN
  Administered 2017-11-24 (×2): 10 mg via ORAL
  Filled 2017-11-21 (×2): qty 2

## 2017-11-21 MED ORDER — ASPIRIN EC 325 MG PO TBEC
325.0000 mg | DELAYED_RELEASE_TABLET | Freq: Two times a day (BID) | ORAL | Status: DC
  Administered 2017-11-22 – 2017-11-24 (×6): 325 mg via ORAL
  Filled 2017-11-21 (×6): qty 1

## 2017-11-21 MED ORDER — MIDAZOLAM HCL 2 MG/2ML IJ SOLN
INTRAMUSCULAR | Status: AC
  Administered 2017-11-21: 2 mg via INTRAVENOUS
  Filled 2017-11-21: qty 2

## 2017-11-21 MED ORDER — BUPIVACAINE IN DEXTROSE 0.75-8.25 % IT SOLN
INTRATHECAL | Status: DC | PRN
  Administered 2017-11-21: 15 mg via INTRATHECAL

## 2017-11-21 MED ORDER — SODIUM CHLORIDE 0.9 % IV SOLN
1000.0000 mg | Freq: Once | INTRAVENOUS | Status: AC
  Administered 2017-11-21: 1000 mg via INTRAVENOUS
  Filled 2017-11-21: qty 10

## 2017-11-21 MED ORDER — CEFAZOLIN SODIUM-DEXTROSE 2-4 GM/100ML-% IV SOLN
INTRAVENOUS | Status: AC
Start: 2017-11-21 — End: 2017-11-21
  Filled 2017-11-21: qty 100

## 2017-11-21 MED ORDER — GLYCOPYRROLATE 0.2 MG/ML IJ SOLN
INTRAMUSCULAR | Status: DC | PRN
  Administered 2017-11-21: 0.2 mg via INTRAVENOUS

## 2017-11-21 MED ORDER — CEFAZOLIN SODIUM-DEXTROSE 2-4 GM/100ML-% IV SOLN
2.0000 g | INTRAVENOUS | Status: AC
  Administered 2017-11-21: 2 g via INTRAVENOUS

## 2017-11-21 MED ORDER — ALUM & MAG HYDROXIDE-SIMETH 200-200-20 MG/5ML PO SUSP
30.0000 mL | ORAL | Status: DC | PRN
Start: 2017-11-21 — End: 2017-11-24

## 2017-11-21 MED ORDER — SUGAMMADEX SODIUM 500 MG/5ML IV SOLN
INTRAVENOUS | Status: AC
  Filled 2017-11-21: qty 5

## 2017-11-21 MED ORDER — METOCLOPRAMIDE HCL 5 MG PO TABS: 5.0000 mg | ORAL_TABLET | Freq: Three times a day (TID) | ORAL | Status: DC | PRN

## 2017-11-21 MED ORDER — DEXTROSE 5 % IV SOLN
INTRAVENOUS | Status: DC | PRN
  Administered 2017-11-21: 25 ug/min via INTRAVENOUS

## 2017-11-21 MED ORDER — BUPIVACAINE LIPOSOME 1.3 % IJ SUSP
INTRAMUSCULAR | Status: DC | PRN
  Administered 2017-11-21: 20 mL

## 2017-11-21 MED ORDER — METOCLOPRAMIDE HCL 5 MG/ML IJ SOLN: 5.0000 mg | Freq: Three times a day (TID) | INTRAMUSCULAR | Status: DC | PRN

## 2017-11-21 MED ORDER — SORBITOL 70 % SOLN: 30.0000 mL | Freq: Every day | Status: DC | PRN

## 2017-11-21 MED ORDER — SENNOSIDES-DOCUSATE SODIUM 8.6-50 MG PO TABS: 1.0000 | ORAL_TABLET | Freq: Every evening | ORAL | 1 refills | Status: DC | PRN

## 2017-11-21 MED ORDER — TRANEXAMIC ACID 1000 MG/10ML IV SOLN
1000.0000 mg | INTRAVENOUS | Status: AC
  Administered 2017-11-21: 1000 mg via INTRAVENOUS
  Filled 2017-11-21: qty 10

## 2017-11-21 MED ORDER — MENTHOL 3 MG MT LOZG
1.0000 | LOZENGE | OROMUCOSAL | Status: DC | PRN
Start: 2017-11-21 — End: 2017-11-24

## 2017-11-21 MED ORDER — OXYCODONE HCL ER 10 MG PO T12A: 10.0000 mg | EXTENDED_RELEASE_TABLET | Freq: Two times a day (BID) | ORAL | 0 refills | Status: DC

## 2017-11-21 MED ORDER — MIDAZOLAM HCL 2 MG/2ML IJ SOLN
INTRAMUSCULAR | Status: AC
Start: 2017-11-21 — End: 2017-11-21
  Filled 2017-11-21: qty 2

## 2017-11-21 MED ORDER — SODIUM CHLORIDE 0.9% FLUSH
INTRAVENOUS | Status: DC | PRN
  Administered 2017-11-21: 40 mL

## 2017-11-21 MED ORDER — OXYCODONE HCL 5 MG/5ML PO SOLN: 5.0000 mg | Freq: Once | ORAL | Status: DC | PRN

## 2017-11-21 MED ORDER — MIDAZOLAM HCL 2 MG/2ML IJ SOLN
1.0000 mg | INTRAMUSCULAR | Status: DC | PRN
  Administered 2017-11-21: 2 mg via INTRAVENOUS

## 2017-11-21 MED ORDER — ACETAMINOPHEN 500 MG PO TABS
1000.0000 mg | ORAL_TABLET | Freq: Four times a day (QID) | ORAL | Status: AC
  Administered 2017-11-21 – 2017-11-22 (×4): 1000 mg via ORAL
  Filled 2017-11-21 (×4): qty 2

## 2017-11-21 MED ORDER — VANCOMYCIN HCL 1000 MG IV SOLR
INTRAVENOUS | Status: DC | PRN
  Administered 2017-11-21: 1000 mg via TOPICAL

## 2017-11-21 MED ORDER — PROMETHAZINE HCL 25 MG PO TABS: 25.0000 mg | ORAL_TABLET | Freq: Four times a day (QID) | ORAL | 1 refills | Status: DC | PRN

## 2017-11-21 MED ORDER — ONDANSETRON HCL 4 MG PO TABS: 4.0000 mg | ORAL_TABLET | Freq: Four times a day (QID) | ORAL | Status: DC | PRN

## 2017-11-21 MED ORDER — ALLOPURINOL 300 MG PO TABS
300.0000 mg | ORAL_TABLET | Freq: Every day | ORAL | Status: DC
  Administered 2017-11-22 – 2017-11-24 (×3): 300 mg via ORAL
  Filled 2017-11-21 (×3): qty 1

## 2017-11-21 MED ORDER — PREDNISONE 20 MG PO TABS
20.0000 mg | ORAL_TABLET | Freq: Every day | ORAL | Status: DC
  Administered 2017-11-22 – 2017-11-24 (×3): 20 mg via ORAL
  Filled 2017-11-21 (×3): qty 1

## 2017-11-21 MED ORDER — FENTANYL CITRATE (PF) 100 MCG/2ML IJ SOLN
INTRAMUSCULAR | Status: AC
  Administered 2017-11-21: 100 ug via INTRAVENOUS
  Filled 2017-11-21: qty 2

## 2017-11-21 MED ORDER — OXYCODONE HCL 5 MG PO TABS: 5.0000 mg | ORAL_TABLET | Freq: Once | ORAL | Status: DC | PRN

## 2017-11-21 MED ORDER — PROPOFOL 500 MG/50ML IV EMUL
INTRAVENOUS | Status: DC | PRN
  Administered 2017-11-21: 50 ug/kg/min via INTRAVENOUS
  Administered 2017-11-21: 100 ug/kg/min via INTRAVENOUS

## 2017-11-21 MED ORDER — VANCOMYCIN HCL 1000 MG IV SOLR
INTRAVENOUS | Status: AC
  Filled 2017-11-21: qty 1000

## 2017-11-21 MED ORDER — ONDANSETRON HCL 4 MG/2ML IJ SOLN
INTRAMUSCULAR | Status: DC | PRN
  Administered 2017-11-21: 4 mg via INTRAVENOUS

## 2017-11-21 MED ORDER — LACTATED RINGERS IV SOLN
INTRAVENOUS | Status: DC
  Administered 2017-11-21: 11:00:00 via INTRAVENOUS

## 2017-11-21 MED ORDER — METHOCARBAMOL 500 MG PO TABS
500.0000 mg | ORAL_TABLET | Freq: Four times a day (QID) | ORAL | Status: DC | PRN
  Administered 2017-11-23 – 2017-11-24 (×2): 500 mg via ORAL
  Filled 2017-11-21 (×2): qty 1

## 2017-11-21 MED ORDER — 0.9 % SODIUM CHLORIDE (POUR BTL) OPTIME
TOPICAL | Status: DC | PRN
  Administered 2017-11-21: 1000 mL

## 2017-11-21 MED ORDER — ROPIVACAINE HCL 5 MG/ML IJ SOLN
INTRAMUSCULAR | Status: DC | PRN
  Administered 2017-11-21: 20 mL via PERINEURAL

## 2017-11-21 MED ORDER — ACETAMINOPHEN 325 MG PO TABS
650.0000 mg | ORAL_TABLET | ORAL | Status: DC | PRN
  Administered 2017-11-23 – 2017-11-24 (×2): 650 mg via ORAL
  Filled 2017-11-21 (×2): qty 2

## 2017-11-21 SURGICAL SUPPLY — 66 items
ALCOHOL ISOPROPYL (RUBBING) (MISCELLANEOUS) ×2 IMPLANT
APL SKNCLS STERI-STRIP NONHPOA (GAUZE/BANDAGES/DRESSINGS)
BAG DECANTER FOR FLEXI CONT (MISCELLANEOUS) ×2 IMPLANT
BANDAGE ACE 6X5 VEL STRL LF (GAUZE/BANDAGES/DRESSINGS) ×4 IMPLANT
BANDAGE ESMARK 6X9 LF (GAUZE/BANDAGES/DRESSINGS) ×1 IMPLANT
BENZOIN TINCTURE PRP APPL 2/3 (GAUZE/BANDAGES/DRESSINGS) IMPLANT
BLADE SAW SGTL 13.0X1.19X90.0M (BLADE) ×2 IMPLANT
BNDG ESMARK 6X9 LF (GAUZE/BANDAGES/DRESSINGS) ×2
BOWL SMART MIX CTS (DISPOSABLE) ×2 IMPLANT
CAPT KNEE TOTAL 3 ×2 IMPLANT
CEMENT BONE REFOBACIN R1X40 US (Cement) ×4 IMPLANT
CLSR STERI-STRIP ANTIMIC 1/2X4 (GAUZE/BANDAGES/DRESSINGS) ×4 IMPLANT
COVER SURGICAL LIGHT HANDLE (MISCELLANEOUS) ×2 IMPLANT
CUFF TOURNIQUET SINGLE 34IN LL (TOURNIQUET CUFF) ×2 IMPLANT
CUFF TOURNIQUET SINGLE 44IN (TOURNIQUET CUFF) IMPLANT
DRAPE EXTREMITY T 121X128X90 (DRAPE) ×2 IMPLANT
DRAPE HALF SHEET 40X57 (DRAPES) ×2 IMPLANT
DRAPE INCISE IOBAN 66X45 STRL (DRAPES) IMPLANT
DRAPE ORTHO SPLIT 77X108 STRL (DRAPES) ×4
DRAPE POUCH INSTRU U-SHP 10X18 (DRAPES) ×2 IMPLANT
DRAPE SURG 17X11 SM STRL (DRAPES) ×4 IMPLANT
DRAPE SURG ORHT 6 SPLT 77X108 (DRAPES) ×2 IMPLANT
DRSG AQUACEL AG ADV 3.5X14 (GAUZE/BANDAGES/DRESSINGS) ×2 IMPLANT
DURAPREP 26ML APPLICATOR (WOUND CARE) ×2 IMPLANT
ELECT CAUTERY BLADE 6.4 (BLADE) ×2 IMPLANT
ELECT REM PT RETURN 9FT ADLT (ELECTROSURGICAL) ×2
ELECTRODE REM PT RTRN 9FT ADLT (ELECTROSURGICAL) ×1 IMPLANT
GLOVE BIOGEL PI IND STRL 7.0 (GLOVE) ×2 IMPLANT
GLOVE BIOGEL PI INDICATOR 7.0 (GLOVE) ×2
GLOVE ECLIPSE 7.0 STRL STRAW (GLOVE) ×4 IMPLANT
GLOVE SKINSENSE NS SZ7.5 (GLOVE) ×1
GLOVE SKINSENSE STRL SZ7.5 (GLOVE) ×1 IMPLANT
GLOVE SURG SYN 7.5  E (GLOVE) ×4
GLOVE SURG SYN 7.5 E (GLOVE) ×4 IMPLANT
GOWN STRL REIN XL XLG (GOWN DISPOSABLE) ×2 IMPLANT
GOWN STRL REUS W/ TWL LRG LVL3 (GOWN DISPOSABLE) ×1 IMPLANT
GOWN STRL REUS W/TWL LRG LVL3 (GOWN DISPOSABLE) ×2
HANDPIECE INTERPULSE COAX TIP (DISPOSABLE) ×2
HOOD PEEL AWAY FLYTE STAYCOOL (MISCELLANEOUS) ×4 IMPLANT
KIT BASIN OR (CUSTOM PROCEDURE TRAY) ×2 IMPLANT
KIT ROOM TURNOVER OR (KITS) ×2 IMPLANT
MANIFOLD NEPTUNE II (INSTRUMENTS) ×2 IMPLANT
MARKER SKIN DUAL TIP RULER LAB (MISCELLANEOUS) ×2 IMPLANT
NEEDLE SPNL 18GX3.5 QUINCKE PK (NEEDLE) ×2 IMPLANT
NS IRRIG 1000ML POUR BTL (IV SOLUTION) ×2 IMPLANT
PACK TOTAL JOINT (CUSTOM PROCEDURE TRAY) ×2 IMPLANT
PAD ARMBOARD 7.5X6 YLW CONV (MISCELLANEOUS) ×4 IMPLANT
PIN TROCAR 3 INCH (PIN) ×2 IMPLANT
SAW OSC TIP CART 19.5X105X1.3 (SAW) ×2 IMPLANT
SET HNDPC FAN SPRY TIP SCT (DISPOSABLE) ×1 IMPLANT
STAPLER VISISTAT 35W (STAPLE) IMPLANT
SUCTION FRAZIER HANDLE 10FR (MISCELLANEOUS) ×1
SUCTION TUBE FRAZIER 10FR DISP (MISCELLANEOUS) ×1 IMPLANT
SUT ETHILON 2 0 FS 18 (SUTURE) IMPLANT
SUT MNCRL AB 4-0 PS2 18 (SUTURE) IMPLANT
SUT VIC AB 0 CT1 27 (SUTURE) ×2
SUT VIC AB 0 CT1 27XBRD ANBCTR (SUTURE) ×2 IMPLANT
SUT VIC AB 1 CTX 27 (SUTURE) ×6 IMPLANT
SUT VIC AB 2-0 CT1 27 (SUTURE) ×3
SUT VIC AB 2-0 CT1 TAPERPNT 27 (SUTURE) ×3 IMPLANT
SYR 50ML LL SCALE MARK (SYRINGE) ×2 IMPLANT
TOWEL OR 17X24 6PK STRL BLUE (TOWEL DISPOSABLE) ×2 IMPLANT
TOWEL OR 17X26 10 PK STRL BLUE (TOWEL DISPOSABLE) ×2 IMPLANT
TRAY CATH 16FR W/PLASTIC CATH (SET/KITS/TRAYS/PACK) ×2 IMPLANT
UNDERPAD 30X30 (UNDERPADS AND DIAPERS) ×2 IMPLANT
WRAP KNEE MAXI GEL POST OP (GAUZE/BANDAGES/DRESSINGS) IMPLANT

## 2017-11-21 NOTE — Discharge Instructions (Signed)

## 2017-11-21 NOTE — OR Nursing (Signed)
According to pt, no known issues with betadine.  Prepped with duraprep.

## 2017-11-21 NOTE — Transfer of Care (Signed)
Immediate Anesthesia Transfer of Care Note  Patient: AUM CAGGIANO  Procedure(s) Performed: LEFT TOTAL KNEE ARTHROPLASTY (Left Knee)  Patient Location: PACU  Anesthesia Type:MAC and spinal  Level of Consciousness: awake, alert  and oriented  Airway & Oxygen Therapy: Patient Spontanous Breathing and Patient connected to face mask oxygen  Post-op Assessment: Report given to RN and Post -op Vital signs reviewed and stable  Post vital signs: Reviewed and stable  Last Vitals:  Vitals:   11/21/17 1335 11/21/17 1340  BP: (!) 150/101 (!) 151/111  Pulse: 90 91  Resp: 18 15  Temp:    SpO2: 99% 97%    Last Pain:  Vitals:   11/21/17 1031  TempSrc: Oral      Patients Stated Pain Goal: 0 (38/88/28 0034)  Complications: No apparent anesthesia complications

## 2017-11-21 NOTE — H&P (Signed)
PREOPERATIVE H&P  Chief Complaint: left knee degenerative joint disease  HPI: Ian Wilson is a 57 y.o. male who presents for surgical treatment of left knee degenerative joint disease.  He denies any changes in medical history.  Past Medical History:  Diagnosis Date  . Arthritis    primary localized osteoarthritis B/L knees  . Diverticulosis   . Gout   . Headache   . HTN (hypertension)   . Obesity   . Wears glasses    Past Surgical History:  Procedure Laterality Date  . COLONOSCOPY    . NO PAST SURGERIES     Social History   Socioeconomic History  . Marital status: Married    Spouse name: None  . Number of children: 1  . Years of education: None  . Highest education level: None  Social Needs  . Financial resource strain: None  . Food insecurity - worry: None  . Food insecurity - inability: None  . Transportation needs - medical: None  . Transportation needs - non-medical: None  Occupational History  . Occupation: Airline pilot    Tobacco Use  . Smoking status: Never Smoker  . Smokeless tobacco: Never Used  Substance and Sexual Activity  . Alcohol use: No  . Drug use: No  . Sexual activity: None  Other Topics Concern  . None  Social History Narrative   Lives w/ wife    Family History  Problem Relation Age of Onset  . CAD Mother        CABG in her 34s  . Stroke Mother   . Diabetes Mother   . Prostate cancer Father        dx in his 46s  . Colon cancer Neg Hx    Allergies  Allergen Reactions  . Bee Venom Anaphylaxis  . Peanut Oil Anaphylaxis  . Shellfish-Derived Products Anaphylaxis   Prior to Admission medications   Medication Sig Start Date End Date Taking? Authorizing Provider  allopurinol (ZYLOPRIM) 300 MG tablet Take 1 tablet (300 mg total) daily by mouth. 08/21/17  Yes Paz, Nolon Rod, MD  CAMPHOR-MENTHOL EX Apply 1 application topically 2 (two) times daily as needed (for knee pain.). Pain-A-Trate Extra Strength Pain Relieving Cream   Yes  [provider]  colchicine 0.6 MG tablet Take 0.6 mg by mouth 2 (two) times daily. 11/13/17  Yes [provider]  ibuprofen (ADVIL,MOTRIN) 200 MG tablet Take 600 mg by mouth every 6 (six) hours as needed (for pain.).    Yes [provider]  predniSONE (DELTASONE) 20 MG tablet Take 20 mg by mouth daily with breakfast. Take 2  Tablets by mouth every day until pain relieved, reserve the remainder. 11/06/17  Yes [provider]     Positive ROS: All other systems have been reviewed and were otherwise negative with the exception of those mentioned in the HPI and as above.  Physical Exam: General: Alert, no acute distress Cardiovascular: No pedal edema Respiratory: No cyanosis, no use of accessory musculature GI: abdomen soft Skin: No lesions in the area of chief complaint Neurologic: Sensation intact distally Psychiatric: Patient is competent for consent with normal mood and affect Lymphatic: no lymphedema  MUSCULOSKELETAL: exam stable  Assessment: left knee degenerative joint disease  Plan: Plan for Procedure(s): LEFT TOTAL KNEE ARTHROPLASTY  The risks benefits and alternatives were discussed with the patient including but not limited to the risks of nonoperative treatment, versus surgical intervention including infection, bleeding, nerve injury,  blood clots, cardiopulmonary complications,  morbidity, mortality, among others, and they were willing to proceed.   Glee ArvinMichael Xu, MD   11/21/2017 1:47 PM

## 2017-11-21 NOTE — Anesthesia Postprocedure Evaluation (Signed)
Anesthesia Post Note  Patient: Ian SiresGeorge D Wilson  Procedure(s) Performed: LEFT TOTAL KNEE ARTHROPLASTY (Left Knee)     Patient location during evaluation: PACU Anesthesia Type: Regional and Spinal Level of consciousness: awake and alert, patient cooperative and oriented Pain management: pain level controlled Vital Signs Assessment: post-procedure vital signs reviewed and stable Respiratory status: spontaneous breathing, nonlabored ventilation, patient connected to nasal cannula oxygen and respiratory function stable Cardiovascular status: blood pressure returned to baseline and stable Postop Assessment: no apparent nausea or vomiting and spinal receding Anesthetic complications: no    Last Vitals:  Vitals:   11/21/17 1720 11/21/17 1739  BP: 112/89 127/83  Pulse: 66 85  Resp: 16 16  Temp: 36.5 C 36.5 C  SpO2: 97% 100%    Last Pain:  Vitals:   11/21/17 1739  TempSrc: Oral  PainSc:                  [name],E. Othmar Ringer

## 2017-11-21 NOTE — Anesthesia Preprocedure Evaluation (Addendum)
Anesthesia Evaluation  Patient identified by MRN, date of birth, ID band Patient awake    Reviewed: Allergy & Precautions, NPO status , Patient's Chart, lab work & pertinent test results  History of Anesthesia Complications Negative for: history of anesthetic complications  Airway Mallampati: II  TM Distance: >3 FB Neck ROM: Full    Dental no notable dental hx.    Pulmonary neg pulmonary ROS,    Pulmonary exam normal breath sounds clear to auscultation       Cardiovascular hypertension, Pt. on medications (-) anginanegative cardio ROS Normal cardiovascular exam Rhythm:Regular Rate:Normal     Neuro/Psych negative neurological ROS  negative psych ROS   GI/Hepatic negative GI ROS, Neg liver ROS,   Endo/Other  negative endocrine ROS  Renal/GU negative Renal ROS  negative genitourinary   Musculoskeletal negative musculoskeletal ROS (+) Arthritis , steroids,    Abdominal   Peds negative pediatric ROS (+)  Hematology negative hematology ROS (+)   Anesthesia Other Findings   Reproductive/Obstetrics negative OB ROS                           Anesthesia Physical Anesthesia Plan  ASA: II  Anesthesia Plan: Spinal and Regional   Post-op Pain Management:  Regional for Post-op pain   Induction: Intravenous  PONV Risk Score and Plan: 1 and Ondansetron  Airway Management Planned: Simple Face Mask  Additional Equipment:   Intra-op Plan:   Post-operative Plan:   Informed Consent: I have reviewed the patients History and Physical, chart, labs and discussed the procedure including the risks, benefits and alternatives for the proposed anesthesia with the patient or authorized representative who has indicated his/her understanding and acceptance.   Dental advisory given  Plan Discussed with: CRNA and Surgeon  Anesthesia Plan Comments:        Anesthesia Quick Evaluation

## 2017-11-21 NOTE — Progress Notes (Signed)
Orthopedic Tech Progress Note Patient Details:  Sherene SiresGeorge D Denis Aug 31, 1961 [id number]  CPM Left Knee CPM Left Knee: On Left Knee Flexion (Degrees): 90 Left Knee Extension (Degrees): 0  Post Interventions Patient Tolerated: Well Instructions Provided: Care of device  Jennye MoccasinHughes, Donyel Castagnola Craig 11/21/2017, 5:41 PM

## 2017-11-21 NOTE — Anesthesia Procedure Notes (Signed)
Anesthesia Regional Block: Adductor canal block   Pre-Anesthetic Checklist: ,, timeout performed, Correct Patient, Correct Site, Correct Laterality, Correct Procedure, Correct Position, site marked, Risks and benefits discussed,  Surgical consent,  Pre-op evaluation,  At surgeon's request and post-op pain management  Laterality: Left  Prep: chloraprep       Needles:  Injection technique: Single-shot  Needle Type: Stimiplex     Needle Length: 9cm  Needle Gauge: 21     Additional Needles:   Procedures:,,,, ultrasound used (permanent image in chart),,,,  Narrative:  Start time: 11/21/2017 1:38 PM End time: 11/21/2017 1:43 PM Injection made incrementally with aspirations every 5 mL.  Performed by: Personally  Anesthesiologist: Lowella CurbMiller, Renada Cronin Ray, MD

## 2017-11-21 NOTE — Op Note (Signed)
Total Knee Arthroplasty Procedure Note  Preoperative diagnosis: Left knee osteoarthritis  Postoperative diagnosis:same  Operative procedure: Left total knee arthroplasty. CPT [phone]  Surgeon: N. Glee Arvin, MD  Assist: Oneal Grout, PA-C; necessary for the timely completion of procedure and due to complexity of procedure.  Anesthesia: Spinal, regional  Tourniquet time: 70 mis  Implants used: [employer] Femur: PS 7 Tibia: Genesis 8 Patella: 35 mm, 9 thick Polyethylene: 13 mm  Indication: Ian Wilson is a 57 y.o. year old male with a history of knee pain. Having failed conservative management, the patient elected to proceed with a total knee arthroplasty.  We have reviewed the risk and benefits of the surgery and they elected to proceed after voicing understanding.  Procedure:  After informed consent was obtained and understanding of the risk were voiced including but not limited to bleeding, infection, damage to surrounding structures including nerves and vessels, blood clots, leg length inequality and the failure to achieve desired results, the operative extremity was marked with verbal confirmation of the patient in the holding area.   The patient was then brought to the operating room and transported to the operating room table in the supine position.  A tourniquet was applied to the operative extremity around the upper thigh. The operative limb was then prepped and draped in the usual sterile fashion and preoperative antibiotics were administered.  A time out was performed prior to the start of surgery confirming the correct extremity, preoperative antibiotic administration, as well as team members, implants and instruments available for the case. Correct surgical site was also confirmed with preoperative radiographs. The limb was then elevated for exsanguination and the tourniquet was inflated. A midline incision was made and a standard medial  parapatellar approach was performed.  The patella was prepared and sized to a 35 mm.  A cover was placed on the patella for protection from retractors.  We then turned our attention to the femur. Posterior cruciate ligament was sacrificed. Start site was drilled in the femur and the intramedullary distal femoral cutting guide was placed, set at 3 degrees valgus, taking 11 mm of distal resection. The distal cut was made. Osteophytes were then removed. Next, the proximal tibial cutting guide was placed with appropriate slope, varus/valgus alignment and depth of resection. The proximal tibial cut was made. Gap blocks were then used to assess the extension gap and alignment, and appropriate soft tissue releases were performed. Attention was turned back to the femur, which was sized using the sizing guide to a size 7. Appropriate rotation of the femoral component was determined using epicondylar axis, Whiteside's line, and assessing the flexion gap under ligament tension. The appropriate size 4-in-1 cutting block was placed and cuts were made. Posterior femoral osteophytes and uncapped bone were then removed with the curved osteotome. The tibia was sized for a size 8 component. The femoral box-cutting guide was placed and prepared for a PS femoral component. Trial components were placed, and stability was checked in full extension, mid-flexion, and deep flexion. Proper tibial rotation was determined and marked.  The patella tracked well without a lateral release. Trial components were then removed and tibial preparation performed. A posterior capsular injection comprising of 20 cc of 1.3% exparel and 40 cc of normal saline was performed for postoperative pain control. The bony surfaces were irrigated with a pulse lavage and then dried. Bone cement was vacuum mixed on the back table, and the final components sized above were cemented into  place. After cement had finished curing, excess cement was removed. The  stability of the construct was re-evaluated throughout a range of motion and found to be acceptable. The trial liner was removed, the knee was copiously irrigated, and the knee was re-evaluated for any excess bone debris. The real polyethylene liner, 13 mm thick, was inserted and checked to ensure the locking mechanism had engaged appropriately. The tourniquet was deflated and hemostasis was achieved. The wound was irrigated with normal saline.  One gram of vancomycin powder was placed in the surgical bed. A drain was not placed. Capsular closure was performed with a #1 vicryl, subcutaneous fat closed with a 0 vicryl suture, then subcutaneous tissue closed with interrupted 2.0 vicryl suture. The skin was then closed with a 3.0 monocryl. A sterile dressing was applied.  The patient was awakened in the operating room and taken to recovery in stable condition. All sponge, needle, and instrument counts were correct at the end of the case.  Position: supine  Complications: none.  Time Out: performed   Drains/Packing: none  Estimated blood loss: minimal  Returned to Recovery Room: in good condition.   Antibiotics: yes   Mechanical VTE (DVT) Prophylaxis: sequential compression devices, TED thigh-high  Chemical VTE (DVT) Prophylaxis: aspirin  Fluid Replacement  Crystalloid: see anesthesia record Blood: none  FFP: none   Specimens Removed: 1 to pathology   Sponge and Instrument Count Correct? yes   PACU: portable radiograph - knee AP and Lateral   Admission: inpatient status  Plan/RTC: Return in 2 weeks for wound check.   Weight Bearing/Load Lower Extremity: full   N. Glee ArvinMichael Mertis Mosher, MD [facility] [phone] 4:34 PM

## 2017-11-21 NOTE — Anesthesia Procedure Notes (Signed)
Spinal  Patient location during procedure: OR End time: 11/21/2017 2:10 PM Staffing Anesthesiologist: Jairo BenJackson, Davina Howlett, MD Performed: anesthesiologist  Preanesthetic Checklist Completed: patient identified, site marked, surgical consent, pre-op evaluation, timeout performed, IV checked, risks and benefits discussed and monitors and equipment checked Spinal Block Patient position: sitting Prep: site prepped and draped and DuraPrep Patient monitoring: blood pressure, continuous pulse ox, cardiac monitor and heart rate Approach: midline Location: L3-4 Injection technique: single-shot Needle Needle type: Quincke  Needle gauge: 22 G Needle length: 12.7 cm Additional Notes Pt identified in Operating room.  Monitors applied. Working IV access confirmed. Sterile prep, drape lumbar spine.  1% lido local L 2,3.  Unable to reach CSF with #24ga Pencan or #22ga Quincke.  Local to L 3,4 and #22ga long Quincke into clear CSF. 15mg  0.75% Bupivacaine with dextrose injected with asp CSF beginning and end of injection.  Patient asymptomatic, VSS, no heme aspirated, tolerated well.  Ian Wilson Lexxi Koslow, MD

## 2017-11-22 ENCOUNTER — Encounter (INDEPENDENT_AMBULATORY_CARE_PROVIDER_SITE_OTHER): Payer: Self-pay

## 2017-11-22 ENCOUNTER — Encounter (HOSPITAL_COMMUNITY): Payer: Self-pay | Admitting: Orthopaedic Surgery

## 2017-11-22 LAB — BASIC METABOLIC PANEL
Anion gap: 12 (ref 5–15)
BUN: 16 mg/dL (ref 6–20)
CALCIUM: 7.7 mg/dL — AB (ref 8.9–10.3)
CO2: 25 mmol/L (ref 22–32)
Chloride: 101 mmol/L (ref 101–111)
Creatinine, Ser: 1.28 mg/dL — ABNORMAL HIGH (ref 0.61–1.24)
GFR calc non Af Amer: >60 mL/min (ref >60)
Glucose, Bld: 118 mg/dL — ABNORMAL HIGH (ref 65–99)
Potassium: 4 mmol/L (ref 3.5–5.1)
SODIUM: 138 mmol/L (ref 135–145)

## 2017-11-22 LAB — CBC
HCT: 40.2 % (ref 39.0–52.0)
Hemoglobin: 12.7 g/dL — ABNORMAL LOW (ref 13.0–17.0)
MCH: 29.9 pg (ref 26.0–34.0)
MCHC: 31.6 g/dL (ref 30.0–36.0)
MCV: 94.6 fL (ref 78.0–100.0)
PLATELETS: 208 10*3/uL (ref 150–400)
RBC: 4.25 MIL/uL (ref 4.22–5.81)
RDW: 14.3 % (ref 11.5–15.5)
WBC: 7.8 10*3/uL (ref 4.0–10.5)

## 2017-11-22 NOTE — Progress Notes (Signed)
Physical Therapy Treatment Patient Details Name: Ian SiresGeorge D Wilson MRN: [id number] DOB: 11/15/1960 Today's Date: 11/22/2017    History of Present Illness 57 y.o. male s/p L TKA. PMH includes: HTN, Obesity    PT Comments    Patient doing well. Session focused on improving gait mechanics and stair training. Still ambulating slowly but with good safety and stability. Pt supervision for walking in hallway with RW, and min guard-min A on stairs with no LOB. Will continue to focus on stairs next visit.    Follow Up Recommendations  Home health PT;Supervision for mobility/OOB;DC plan and follow up therapy as arranged by surgeon     Equipment Recommendations  3in1 (PT)    Recommendations for Other Services       Precautions / Restrictions Precautions Precautions: Knee Precaution Booklet Issued: Yes (comment) Precaution Comments: educated on knee extension and elevation for edema management Restrictions Weight Bearing Restrictions: Yes LLE Weight Bearing: Weight bearing as tolerated    Mobility  Bed Mobility Overal bed mobility: Needs Assistance Bed Mobility: Supine to Sit     Supine to sit: Supervision     General bed mobility comments: transfering to bathroom on arrival  Transfers Overall transfer level: Needs assistance Equipment used: Rolling walker (2 wheeled) Transfers: Sit to/from Stand Sit to Stand: Supervision         General transfer comment: supervision good hand placement  Ambulation/Gait Ambulation/Gait assistance: Min guard;Supervision Ambulation Distance (Feet): 150 Feet Assistive device: Rolling walker (2 wheeled) Gait Pattern/deviations: Step-to pattern;Antalgic;Decreased step length - right Gait velocity: decreased   General Gait Details: improving mechanics, still slow. supervision    Stairs Stairs: Yes   Stair Management: With walker;Backwards;Step to pattern Number of Stairs: 12 General stair comments: cues for sequencing and techinque,  patient ambulating with min guard backwards with RW. no LOB.   Wheelchair Mobility    Modified Rankin (Stroke Patients Only)       Balance Overall balance assessment: Needs assistance Sitting-balance support: Feet unsupported;No upper extremity supported Sitting balance-Leahy Scale: Normal     Standing balance support: During functional activity;Bilateral upper extremity supported Standing balance-Leahy Scale: Fair Standing balance comment: RW for dynamic mobility.                             Cognition Arousal/Alertness: Awake/alert Behavior During Therapy: WFL for tasks assessed/performed Overall Cognitive Status: Within Functional Limits for tasks assessed                                        Exercises Total Joint Exercises Ankle Circles/Pumps: 20 reps    General Comments        Pertinent Vitals/Pain Pain Assessment: Faces Faces Pain Scale: Hurts a little bit Pain Location: L knee Pain Descriptors / Indicators: Operative site guarding Pain Intervention(s): Limited activity within patient's tolerance;Monitored during session;Premedicated before session;Repositioned    Home Living Family/patient expects to be discharged to:: Private residence Living Arrangements: Spouse/significant other;Children Available Help at Discharge: Family Type of Home: House Home Access: Stairs to enter   Home Layout: Two level Home Equipment: Environmental consultantWalker - 2 wheels;Crutches      Prior Function Level of Independence: Independent          PT Goals (current goals can now be found in the care plan section) Acute Rehab PT Goals Patient Stated Goal: to return home PT Goal  Formulation: With patient Time For Goal Achievement: 11/29/17 Potential to Achieve Goals: Good    Frequency    7X/week      PT Plan Current plan remains appropriate    Co-evaluation              AM-PAC PT "6 Clicks" Daily Activity  Outcome Measure  Difficulty turning  over in bed (including adjusting bedclothes, sheets and blankets)?: A Little Difficulty moving from lying on back to sitting on the side of the bed? : A Little Difficulty sitting down on and standing up from a chair with arms (e.g., wheelchair, bedside commode, etc,.)?: A Little Help needed moving to and from a bed to chair (including a wheelchair)?: A Little Help needed walking in hospital room?: A Little Help needed climbing 3-5 steps with a railing? : A Little 6 Click Score: 18    End of Session Equipment Utilized During Treatment: Gait belt Activity Tolerance: Patient tolerated treatment well Patient left: in chair;with call bell/phone within reach Nurse Communication: Mobility status PT Visit Diagnosis: Unsteadiness on feet (R26.81);Other abnormalities of gait and mobility (R26.89);Muscle weakness (generalized) (M62.81);Pain Pain - Right/Left: Left Pain - part of body: Knee     Time: 1725-1757 PT Time Calculation (min) (ACUTE ONLY): 32 min  Charges:  $Gait Training: 23-37 mins                    G Codes:       Etta Grandchild, PT, DPT Acute Rehab Services Pager: [phone]     Etta Grandchild 11/22/2017, 6:03 PM

## 2017-11-22 NOTE — Progress Notes (Signed)
Subjective: 1 Day Post-Op Procedure(s) (LRB): LEFT TOTAL KNEE ARTHROPLASTY (Left) Patient reports pain as mild.  Doing well this am.  Feeling great  Objective: Vital signs in last 24 hours: Temp:  [97.6 F (36.4 C)-99.4 F (37.4 C)] 98.4 F (36.9 C) (02/08 0358) Pulse Rate:  [66-93] 79 (02/08 0358) Resp:  [13-20] 17 (02/08 0358) BP: (111-151)/(68-111) 113/70 (02/08 0358) SpO2:  [97 %-100 %] 98 % (02/08 0358)  Intake/Output from previous day: 02/07 0701 - 02/08 0700 In: 2147.3 [P.O.:480; I.V.:1592.3] Out: 950 [Urine:900; Blood:50] Intake/Output this shift: No intake/output data recorded.  Recent Labs    11/22/17 0657  HGB 12.7*   Recent Labs    11/22/17 0657  WBC 7.8  RBC 4.25  HCT 40.2  PLT 208   No results for input(s): NA, K, CL, CO2, BUN, CREATININE, GLUCOSE, CALCIUM in the last 72 hours. No results for input(s): LABPT, INR in the last 72 hours.  Neurologically intact Neurovascular intact Sensation intact distally Intact pulses distally Dorsiflexion/Plantar flexion intact Incision: dressing C/D/I No cellulitis present Compartment soft  Assessment/Plan: 1 Day Post-Op Procedure(s) (LRB): LEFT TOTAL KNEE ARTHROPLASTY (Left) Advance diet Up with therapy D/C IV fluids Plan for discharge tomorrow with hhpt WBAT LLE ABLA-mild and stable Dry dressing change pnr PLEASE REMOVE ACE BANDAGE AND APPLY TED HOSE  Cristie HemMary L Mylea Roarty 11/22/2017, 8:05 AM

## 2017-11-22 NOTE — Care Management Note (Signed)
Case Management Note  Patient Details  Name: Ian Wilson MRN: [id number] Date of Birth: 12-Apr-1961  Subjective/Objective:   Left TKA                 Action/Plan: Spoke to pt and wife at bedside. [facility] Will need orders for HHPT. Will contact [facility] for DME for home, RW and 3n1 to be delivered to room prior to dc. Will have surgeon clarify if CPM needed at home. Pt will discuss with his surgeon.   Expected Discharge Date:                  Expected Discharge Plan:  Home w Home Health Services  In-House Referral:  NA  Discharge planning Services  CM Consult  Post Acute Care Choice:  Home Health Choice offered to:  Patient  DME Arranged:  RW, 3n1 DME Agency:  [facility]  HH Arranged:  PT HH Agency[facility]  [facility]  Status of Service:  Completed, signed off  If discussed at [employer] Length of Stay Meetings, dates discussed:    Additional Comments:  Elliot CousinShavis, Makynzie Dobesh Ellen, RN 11/22/2017, 5:52 PM

## 2017-11-22 NOTE — Progress Notes (Signed)
Physical Therapy Treatment Patient Details Name: Ian Wilson MRN: [id number] DOB: [birth year] Today's Date: 11/22/2017    History of Present Illness 57 y.o. male s/p L TKA. PMH includes: HTN, Obesity    PT Comments    Patient is s/p above surgery resulting in functional limitations due to the deficits listed below (see PT Problem List). PTA, patient independent with all mobility,living with wife in 2 story home with stairs to enter. Upon eval, pt presents with post op pain and weakness in LLE that limits his mobility. Currently min guard level for OOB mobility and ambulating in hallway with poor mechanics at this time. Next PT visit will increase acitivty tolerance and introduce stair training as patient wishes to be able to remain on 2nd story bedroom at home after discharge. Patient will benefit from skilled PT to increase their independence and safety with mobility to allow discharge to the venue listed below.     Follow Up Recommendations  Home health PT;Supervision for mobility/OOB;DC plan and follow up therapy as arranged by surgeon     Equipment Recommendations  3in1 (PT)    Recommendations for Other Services       Precautions / Restrictions Precautions Precautions: Knee Precaution Booklet Issued: Yes (comment) Precaution Comments: reveiwed supine therex and no pillow under knee Restrictions Weight Bearing Restrictions: Yes LLE Weight Bearing: Weight bearing as tolerated    Mobility  Bed Mobility Overal bed mobility: Needs Assistance Bed Mobility: Supine to Sit     Supine to sit: Supervision        Transfers Overall transfer level: Needs assistance Equipment used: Rolling walker (2 wheeled) Transfers: Sit to/from Stand Sit to Stand: Min assist         General transfer comment: Min A first power up into RW, cues for hand placement.   Ambulation/Gait Ambulation/Gait assistance: Min guard;Supervision Ambulation Distance (Feet): 100 Feet Assistive  device: Rolling walker (2 wheeled) Gait Pattern/deviations: Step-to pattern;Antalgic;Decreased step length - right Gait velocity: decreased   General Gait Details: patient with slow and painful gait, cues for heel strike and step length, sequencing with RW. Patient with R genu varus and hyperextension in stance phase. able to progress to supervision with safe stability.    Stairs            Wheelchair Mobility    Modified Rankin (Stroke Patients Only)       Balance Overall balance assessment: Needs assistance Sitting-balance support: Feet unsupported;No upper extremity supported Sitting balance-Leahy Scale: Normal     Standing balance support: During functional activity;Bilateral upper extremity supported Standing balance-Leahy Scale: Fair Standing balance comment: RW for dynamic mobility.                             Cognition Arousal/Alertness: Awake/alert                                            Exercises Total Joint Exercises Ankle Circles/Pumps: 20 reps Quad Sets: 10 reps Heel Slides: 10 reps Long Arc Quad: 10 reps Knee Flexion: 10 reps Goniometric ROM: 95 flexion     General Comments        Pertinent Vitals/Pain Pain Assessment: Faces Faces Pain Scale: Hurts little more Pain Location: L knee    Home Living Family/patient expects to be discharged to:: Private residence Living Arrangements: Spouse/significant other;Children Available  Help at Discharge: Family Type of Home: House Home Access: Stairs to enter   Home Layout: Two level Home Equipment: Environmental consultantWalker - 2 wheels;Crutches      Prior Function Level of Independence: Independent          PT Goals (current goals can now be found in the care plan section) Acute Rehab PT Goals Patient Stated Goal: return home this weekend with HHPT  PT Goal Formulation: With patient Time For Goal Achievement: 11/29/17 Potential to Achieve Goals: Good    Frequency     7X/week      PT Plan      Co-evaluation              AM-PAC PT "6 Clicks" Daily Activity  Outcome Measure  Difficulty turning over in bed (including adjusting bedclothes, sheets and blankets)?: A Little Difficulty moving from lying on back to sitting on the side of the bed? : A Little Difficulty sitting down on and standing up from a chair with arms (e.g., wheelchair, bedside commode, etc,.)?: A Little Help needed moving to and from a bed to chair (including a wheelchair)?: A Little Help needed walking in hospital room?: A Little Help needed climbing 3-5 steps with a railing? : A Little 6 Click Score: 18    End of Session Equipment Utilized During Treatment: Gait belt Activity Tolerance: Patient tolerated treatment well Patient left: in chair;with call bell/phone within reach Nurse Communication: Mobility status PT Visit Diagnosis: Unsteadiness on feet (R26.81);Other abnormalities of gait and mobility (R26.89);Muscle weakness (generalized) (M62.81);Pain Pain - Right/Left: Left Pain - part of body: Knee     Time: 0950-1040 PT Time Calculation (min) (ACUTE ONLY): 50 min  Charges:  $Gait Training: 8-22 mins $Therapeutic Exercise: 8-22 mins $Therapeutic Activity: 8-22 mins                    G Codes:       Etta GrandchildSean Darron Stuck, PT, DPT Acute Rehab Services Pager: [phone]     Etta GrandchildSean  Akiem Urieta 11/22/2017, 11:21 AM

## 2017-11-22 NOTE — Evaluation (Signed)
Occupational Therapy Evaluation Patient Details Name: Ian SiresGeorge D Wassmer MRN: [id number] DOB: 02-06-1961 Today's Date: 11/22/2017    History of Present Illness 57 y.o. male s/p L TKA. PMH includes: HTN, Obesity   Clinical Impression   Patient evaluated by Occupational Therapy with no further acute OT needs identified. All education has been completed and the patient has no further questions. See below for any follow-up Occupational Therapy or equipment needs. OT to sign off. Thank you for referral.      Follow Up Recommendations  No OT follow up    Equipment Recommendations  3 in 1 bedside commode;Other (comment)(RW)    Recommendations for Other Services       Precautions / Restrictions Precautions Precautions: Knee Precaution Comments: educated on knee extension and elevation for edema management Restrictions Weight Bearing Restrictions: Yes LLE Weight Bearing: Weight bearing as tolerated      Mobility Bed Mobility               General bed mobility comments: transfering to bathroom on arrival  Transfers Overall transfer level: Needs assistance Equipment used: Rolling walker (2 wheeled) Transfers: Sit to/from Stand Sit to Stand: Min guard         General transfer comment: cues for hand placement    Balance                                           ADL either performed or assessed with clinical judgement   ADL Overall ADL's : Needs assistance/impaired Eating/Feeding: Independent   Grooming: Wash/dry hands Grooming Details (indicate cue type and reason): washing hands at sink Upper Body Bathing: Independent   Lower Body Bathing: Minimal assistance   Upper Body Dressing : Independent   Lower Body Dressing: Minimal assistance Lower Body Dressing Details (indicate cue type and reason): pt able to cross R LE and able to reach mid foot on the L  Toilet Transfer: Supervision/safety;Ambulation;RW Toilet Transfer Details (indicate cue  type and reason): pt could benefit from 3n1 for home. wife wants to check on personal purchase     Tub/ Shower Transfer: Walk-in Office managershower;Supervision/safety;Rolling walker Tub/Shower Transfer Details (indicate cue type and reason): educated and demo transfers Functional mobility during ADLs: Supervision/safety General ADL Comments: pt demonstrates some weakness with shower transfer . pt advised to sponge bath for a few days and then attempt with wife present. pt fatigued at this time  Educated patient on knee full extension with return demonstration, educated shower transfer,never to wash directly on incision site, always use fresh clean linen (one time use then place in laundry), avoid water under bandage and benefits of wrapping dressing, sleeping positioning, avoid putting pillow under knee, educated on use of a belt or sheet as a leg lifter     Vision Baseline Vision/History: No visual deficits       Perception     Praxis      Pertinent Vitals/Pain Pain Assessment: Faces Faces Pain Scale: Hurts a little bit Pain Location: L knee Pain Descriptors / Indicators: Operative site guarding Pain Intervention(s): Monitored during session;Premedicated before session;Repositioned     Hand Dominance Right   Extremity/Trunk Assessment Upper Extremity Assessment Upper Extremity Assessment: Overall WFL for tasks assessed   Lower Extremity Assessment Lower Extremity Assessment: Defer to PT evaluation   Cervical / Trunk Assessment Cervical / Trunk Assessment: Normal   Communication Communication Communication: No difficulties  Cognition Arousal/Alertness: Awake/alert Behavior During Therapy: WFL for tasks assessed/performed Overall Cognitive Status: Within Functional Limits for tasks assessed                                     General Comments       Exercises     Shoulder Instructions      Home Living Family/patient expects to be discharged to:: Private  residence Living Arrangements: Spouse/significant other;Children Available Help at Discharge: Family Type of Home: House Home Access: Stairs to enter Secretary/administrator of Steps: 2   Home Layout: Two level   Alternate Level Stairs-Rails: Right Bathroom Shower/Tub: Tub only   Firefighter: Standard Bathroom Accessibility: Yes   Home Equipment: Environmental consultant - 2 wheels;Crutches          Prior Functioning/Environment Level of Independence: Independent                 OT Problem List:        OT Treatment/Interventions:      OT Goals(Current goals can be found in the care plan section) Acute Rehab OT Goals Patient Stated Goal: to return home  OT Frequency:     Barriers to D/C:            Co-evaluation              AM-PAC PT "6 Clicks" Daily Activity     Outcome Measure Help from another person eating meals?: None Help from another person taking care of personal grooming?: None Help from another person toileting, which includes using toliet, bedpan, or urinal?: None Help from another person bathing (including washing, rinsing, drying)?: None Help from another person to put on and taking off regular upper body clothing?: None Help from another person to put on and taking off regular lower body clothing?: A Little 6 Click Score: 23   End of Session Equipment Utilized During Treatment: Rolling walker CPM Left Knee CPM Left Knee: Off Nurse Communication: Mobility status;Precautions  Activity Tolerance: Patient tolerated treatment well Patient left: in chair;with call bell/phone within reach;with family/visitor present                   Time: 1335-1418 OT Time Calculation (min): 43 min Charges:  OT General Charges $OT Visit: 1 Visit OT Evaluation $OT Eval Moderate Complexity: 1 Mod OT Treatments $Self Care/Home Management : 23-37 mins G-Codes:      Mateo Flow   OTR/L Pager: [phone] Office: [phone] .   Boone Master B 11/22/2017, 3:22  PM

## 2017-11-22 NOTE — Clinical Social Work Note (Signed)
Clinical Social Worker received referral for possible ST-SNF placement.  Chart reviewed.  PT/OT recommending home with home health.  Spoke with RN Case Manager who will follow up with patient to discuss home health needs.    CSW signing off - please re consult if social work needs arise.  Jesse Addalynne Golding, LCSW [phone] 

## 2017-11-23 NOTE — Progress Notes (Signed)
Physical Therapy Treatment Patient Details Name: Ian Wilson MRN: [id number] DOB: [birth year] Today's Date: 11/23/2017    History of Present Illness 57 y.o. male s/p L TKA. PMH includes: HTN, Obesity    PT Comments    Patient progressing well with therapy. Improving mechanics during gait, and introduced forward stair training with cane and rail for in home stairs. Patient min guard to supervision level with rolling walker backwards, however will benefit from continued training and improving activity tolerance with stairs inside the home. Will focus on next visit later this PM. Reviewed all therex and patient understands form and dosage.     Follow Up Recommendations  Home health PT;Supervision for mobility/OOB;DC plan and follow up therapy as arranged by surgeon     Equipment Recommendations  3in1 (PT)    Recommendations for Other Services       Precautions / Restrictions Precautions Precautions: Knee Precaution Booklet Issued: Yes (comment) Precaution Comments: educated on knee extension and elevation for edema management Restrictions Weight Bearing Restrictions: Yes LLE Weight Bearing: Weight bearing as tolerated    Mobility  Bed Mobility Overal bed mobility: Needs Assistance Bed Mobility: Supine to Sit     Supine to sit: Supervision     General bed mobility comments: transfering to bathroom on arrival  Transfers Overall transfer level: Needs assistance Equipment used: Rolling walker (2 wheeled) Transfers: Sit to/from Stand Sit to Stand: Supervision         General transfer comment: supervision good hand placement  Ambulation/Gait Ambulation/Gait assistance: Min guard;Supervision Ambulation Distance (Feet): 150 Feet Assistive device: Rolling walker (2 wheeled) Gait Pattern/deviations: Step-to pattern;Antalgic;Decreased step length - right Gait velocity: decreased   General Gait Details: cues for heel strike, step length patient improving gait,  supervision to mod I with ambulation no concerns of stability or safety   Stairs Stairs: Yes   Stair Management: With cane;Backwards;Step to pattern;One rail Right;With walker Number of Stairs: 24 General stair comments: several trials with RW backwards, and forwards with R rail and L cane support, cues for sequencing and techinque, patient min guard currently but demonstrating good sequencing and safety. Will continue to focus on stairs.  Wheelchair Mobility    Modified Rankin (Stroke Patients Only)       Balance Overall balance assessment: Needs assistance Sitting-balance support: Feet unsupported;No upper extremity supported Sitting balance-Leahy Scale: Normal     Standing balance support: During functional activity;Bilateral upper extremity supported Standing balance-Leahy Scale: Fair Standing balance comment: RW for dynamic mobility.                             Cognition Arousal/Alertness: Awake/alert Behavior During Therapy: WFL for tasks assessed/performed Overall Cognitive Status: Within Functional Limits for tasks assessed                                        Exercises Total Joint Exercises Ankle Circles/Pumps: 20 reps Quad Sets: 10 reps Towel Squeeze: 10 reps Short Arc Quad: 10 reps Heel Slides: 10 reps Hip ABduction/ADduction: 10 reps Long Arc Quad: 10 reps Knee Flexion: 10 reps Goniometric ROM: 95 flexion     General Comments        Pertinent Vitals/Pain Pain Assessment: Faces Faces Pain Scale: Hurts a little bit Pain Location: L knee Pain Descriptors / Indicators: Operative site guarding Pain Intervention(s): Limited activity within patient's tolerance;Monitored during  session;Premedicated before session;Repositioned    Home Living                      Prior Function            PT Goals (current goals can now be found in the care plan section) Acute Rehab PT Goals Patient Stated Goal: to return  home PT Goal Formulation: With patient Time For Goal Achievement: 11/29/17 Potential to Achieve Goals: Good Progress towards PT goals: Progressing toward goals    Frequency    7X/week      PT Plan Current plan remains appropriate    Co-evaluation              AM-PAC PT "6 Clicks" Daily Activity  Outcome Measure  Difficulty turning over in bed (including adjusting bedclothes, sheets and blankets)?: A Little Difficulty moving from lying on back to sitting on the side of the bed? : A Little Difficulty sitting down on and standing up from a chair with arms (e.g., wheelchair, bedside commode, etc,.)?: A Little Help needed moving to and from a bed to chair (including a wheelchair)?: A Little Help needed walking in hospital room?: A Little Help needed climbing 3-5 steps with a railing? : A Little 6 Click Score: 18    End of Session Equipment Utilized During Treatment: Gait belt Activity Tolerance: Patient tolerated treatment well Patient left: in chair;with call bell/phone within reach Nurse Communication: Mobility status PT Visit Diagnosis: Unsteadiness on feet (R26.81);Other abnormalities of gait and mobility (R26.89);Muscle weakness (generalized) (M62.81);Pain Pain - Right/Left: Left Pain - part of body: Knee     Time: [id number] PT Time Calculation (min) (ACUTE ONLY): 48 min  Charges:  $Gait Training: 23-37 mins $Therapeutic Exercise: 8-22 mins                    G Codes:       Etta GrandchildSean Jad Johansson, PT, DPT Acute Rehab Services Pager: [phone]     Etta GrandchildSean  Kano Heckmann 11/23/2017, 9:45 AM

## 2017-11-23 NOTE — Progress Notes (Signed)
Physical Therapy Treatment Patient Details Name: Ian Wilson MRN: [id number] DOB: [birth year] Today's Date: 11/23/2017    History of Present Illness 57 y.o. male s/p L TKA. PMH includes: HTN, Obesity    PT Comments    Patient has done very well with therapy today. Now close supervision on stairs for variety of methods to cover entrance and in-home ambulation. Wife educated on guarding technique and how patient has doing with therapy. Discussion over rehab course of care and expectations, patient and wifes questions and concerns have all been addressed. Patient will benefit from 1 PT session to solidify his confidence and safety on stairs and will be ready from a mobility standpoint to d/c once medically cleared.     Follow Up Recommendations  Home health PT;Supervision for mobility/OOB;DC plan and follow up therapy as arranged by surgeon     Equipment Recommendations  3in1 (PT)    Recommendations for Other Services       Precautions / Restrictions Precautions Precautions: Knee Precaution Booklet Issued: Yes (comment) Precaution Comments: educated on knee extension and elevation for edema management Restrictions Weight Bearing Restrictions: Yes LLE Weight Bearing: Weight bearing as tolerated    Mobility  Bed Mobility Overal bed mobility: Needs Assistance Bed Mobility: Supine to Sit     Supine to sit: Supervision     General bed mobility comments: transfering to bathroom on arrival  Transfers Overall transfer level: Needs assistance Equipment used: Rolling walker (2 wheeled) Transfers: Sit to/from Stand Sit to Stand: Supervision         General transfer comment: supervision good hand placement  Ambulation/Gait Ambulation/Gait assistance: Min guard;Supervision Ambulation Distance (Feet): 100 Feet Assistive device: Rolling walker (2 wheeled) Gait Pattern/deviations: Step-to pattern;Antalgic;Decreased step length - right Gait velocity: decreased   General  Gait Details: cues for heel strike, step length patient improving gait, supervision to mod I with ambulation no concerns of stability or safety   Stairs Stairs: Yes   Stair Management: With cane;Backwards;Step to pattern;One rail Right;With walker Number of Stairs: 24 General stair comments: extensive family education and reinforcement of trials. Wife coached on guarding body position and discussed patients progress to date. Patient now supervision on stairs and will benefit from 1 further session to solidify his confidence and safety. good sequencing with RW and with 1 RAIL 1 SPC forwards. step to pattern.   Wheelchair Mobility    Modified Rankin (Stroke Patients Only)       Balance Overall balance assessment: Needs assistance Sitting-balance support: Feet unsupported;No upper extremity supported Sitting balance-Leahy Scale: Normal     Standing balance support: During functional activity;Bilateral upper extremity supported Standing balance-Leahy Scale: Fair Standing balance comment: RW for dynamic mobility.                             Cognition Arousal/Alertness: Awake/alert Behavior During Therapy: WFL for tasks assessed/performed Overall Cognitive Status: Within Functional Limits for tasks assessed                                        Exercises Total Joint Exercises Ankle Circles/Pumps: 20 reps Quad Sets: 10 reps Towel Squeeze: 10 reps Short Arc Quad: 10 reps Heel Slides: 10 reps Hip ABduction/ADduction: 10 reps Long Arc Quad: 10 reps Knee Flexion: 10 reps Goniometric ROM: 95* flexion    General Comments  Pertinent Vitals/Pain Pain Assessment: Faces Faces Pain Scale: Hurts a little bit Pain Location: L knee Pain Descriptors / Indicators: Operative site guarding Pain Intervention(s): Limited activity within patient's tolerance;Monitored during session;Premedicated before session    Home Living                       Prior Function            PT Goals (current goals can now be found in the care plan section) Acute Rehab PT Goals Patient Stated Goal: to return home PT Goal Formulation: With patient Time For Goal Achievement: 11/29/17 Potential to Achieve Goals: Good Progress towards PT goals: Progressing toward goals    Frequency    7X/week      PT Plan Current plan remains appropriate    Co-evaluation              AM-PAC PT "6 Clicks" Daily Activity  Outcome Measure  Difficulty turning over in bed (including adjusting bedclothes, sheets and blankets)?: A Little Difficulty moving from lying on back to sitting on the side of the bed? : A Little Difficulty sitting down on and standing up from a chair with arms (e.g., wheelchair, bedside commode, etc,.)?: A Little Help needed moving to and from a bed to chair (including a wheelchair)?: A Little Help needed walking in hospital room?: A Little Help needed climbing 3-5 steps with a railing? : A Little 6 Click Score: 18    End of Session Equipment Utilized During Treatment: Gait belt Activity Tolerance: Patient tolerated treatment well Patient left: in chair;with call bell/phone within reach Nurse Communication: Mobility status PT Visit Diagnosis: Unsteadiness on feet (R26.81);Other abnormalities of gait and mobility (R26.89);Muscle weakness (generalized) (M62.81);Pain Pain - Right/Left: Left Pain - part of body: Knee     Time: 1500-1600 PT Time Calculation (min) (ACUTE ONLY): 60 min  Charges:  $Gait Training: 23-37 mins $Therapeutic Exercise: 8-22 mins                    G Codes:      Etta GrandchildSean Seth Friedlander, PT, DPT Acute Rehab Services Pager: [phone]     Etta GrandchildSean  Davy Faught 11/23/2017, 4:21 PM

## 2017-11-23 NOTE — Progress Notes (Signed)
Orthopedic Tech Progress Note Patient Details:  Sherene SiresGeorge D Kramm 08/04/1961 [id number]  CPM Left Knee CPM Left Knee: On Left Knee Flexion (Degrees): 90 Left Knee Extension (Degrees): 0  Post Interventions Patient Tolerated: Well Instructions Provided: Care of device  Saul FordyceJennifer C Richard Ritchey 11/23/2017, 6:22 PM

## 2017-11-23 NOTE — Progress Notes (Signed)
Patient ID: Ian SiresGeorge D Wilson, male   DOB: 1961-01-23, 57 y.o.   MRN: [id number] The patient seems to be doing well.  His vitals are stable and his left operative knee is stable.  He would like to have more therapy here today before going home tomorrow.

## 2017-11-23 NOTE — Care Management (Signed)
DME delivered today.  Question posed by nursing staff if pt could have bariatric equipment.  Per [facility], pt must weigh >300lbs and pt does not qualify.  [facility]

## 2017-11-23 NOTE — Plan of Care (Signed)
  Pain Managment: General experience of comfort will improve 11/23/2017 0240 - Progressing by Sidonie Dickensulla, Sahid Borba, RN

## 2017-11-24 NOTE — Progress Notes (Signed)
Discharge instructions reviewed with patient. Patient verbalized understanding. Patient received 3in1 from [facility] for home use, states he no longer needs it. Spoke with case manager who said to leave it at the front desk so that it may be credited back to patient tomorrow when case management returns. 3in1 left at front desk with signed paper from advanced and patient sticker placed on it.

## 2017-11-24 NOTE — Progress Notes (Signed)
Physical Therapy Treatment Patient Details Name: Ian Wilson MRN: [id number] DOB: 1961/04/27 Today's Date: 11/24/2017    History of Present Illness 57 y.o. male s/p L TKA. PMH includes: HTN, Obesity    PT Comments    Patient is making good progress with PT.  From a mobility standpoint anticipate patient will be ready for DC home when medically ready.    Follow Up Recommendations  Home health PT;Supervision for mobility/OOB;DC plan and follow up therapy as arranged by surgeon     Equipment Recommendations  3in1 (PT)    Recommendations for Other Services       Precautions / Restrictions Precautions Precautions: Knee Precaution Booklet Issued: Yes (comment) Precaution Comments: precautions/positioning reviewed with pt Restrictions Weight Bearing Restrictions: Yes LLE Weight Bearing: Weight bearing as tolerated    Mobility  Bed Mobility Overal bed mobility: Modified Independent Bed Mobility: Supine to Sit           General bed mobility comments: increased time and effort  Transfers Overall transfer level: Modified independent Equipment used: Rolling walker (2 wheeled) Transfers: Sit to/from Stand           General transfer comment: safe hand placement demonstrated; use of RW upon standing for balance  Ambulation/Gait Ambulation/Gait assistance: Supervision Ambulation Distance (Feet): (24100ft X2) Assistive device: Rolling walker (2 wheeled) Gait Pattern/deviations: Antalgic;Step-through pattern;Decreased stride length;Decreased weight shift to left Gait velocity: decreased   General Gait Details: cues for posture, cadence, and step length symmetry; pt with improved gait mechanics with increased distance   Stairs     Stair Management: With cane;Backwards;Step to pattern;One rail Right;With walker Number of Stairs: (2 steps backwards with RW; 10 steps with rail and cane) General stair comments: cues for sequencing and technique; min guard for safety;  pt demonstrated carry over from previous sessions  Wheelchair Mobility    Modified Rankin (Stroke Patients Only)       Balance Overall balance assessment: Needs assistance Sitting-balance support: Feet unsupported;No upper extremity supported Sitting balance-Leahy Scale: Normal     Standing balance support: During functional activity;Bilateral upper extremity supported Standing balance-Leahy Scale: Fair Standing balance comment: able to static stand without UE support                            Cognition Arousal/Alertness: Awake/alert Behavior During Therapy: WFL for tasks assessed/performed Overall Cognitive Status: Within Functional Limits for tasks assessed                                        Exercises      General Comments General comments (skin integrity, edema, etc.): nauseated with stair training but subsided with seated rest break      Pertinent Vitals/Pain Pain Assessment: Faces Faces Pain Scale: Hurts a little bit Pain Location: L knee Pain Descriptors / Indicators: Guarding;Sore Pain Intervention(s): Monitored during session;Repositioned    Home Living                      Prior Function            PT Goals (current goals can now be found in the care plan section) Acute Rehab PT Goals Patient Stated Goal: to return home PT Goal Formulation: With patient Time For Goal Achievement: 11/29/17 Potential to Achieve Goals: Good Progress towards PT goals: Progressing toward goals  Frequency    7X/week      PT Plan Current plan remains appropriate    Co-evaluation              AM-PAC PT "6 Clicks" Daily Activity  Outcome Measure  Difficulty turning over in bed (including adjusting bedclothes, sheets and blankets)?: None Difficulty moving from lying on back to sitting on the side of the bed? : A Little Difficulty sitting down on and standing up from a chair with arms (e.g., wheelchair, bedside  commode, etc,.)?: A Little Help needed moving to and from a bed to chair (including a wheelchair)?: None Help needed walking in hospital room?: A Little Help needed climbing 3-5 steps with a railing? : A Little 6 Click Score: 20    End of Session Equipment Utilized During Treatment: Gait belt Activity Tolerance: Patient tolerated treatment well Patient left: in chair;with call bell/phone within reach Nurse Communication: Mobility status PT Visit Diagnosis: Unsteadiness on feet (R26.81);Other abnormalities of gait and mobility (R26.89);Muscle weakness (generalized) (M62.81);Pain Pain - Right/Left: Left Pain - part of body: Knee     Time: 0832-0926 PT Time Calculation (min) (ACUTE ONLY): 54 min  Charges:  $Gait Training: 23-37 mins $Therapeutic Activity: 8-22 mins                    G Codes:       Erline Levine, PTA Pager: [phone]     Carolynne Edouard 11/24/2017, 9:40 AM

## 2017-11-24 NOTE — Progress Notes (Signed)
Patient ID: Ian SiresGeorge D Wilson, male   DOB: 11-12-1960, 57 y.o.   MRN: [id number] Much better this am.  Work already with PT.  Knee stable.  Vitals stable.  Can be discharged to home today.

## 2017-11-24 NOTE — Discharge Summary (Signed)
Patient ID: Ian Wilson MRN: [name]130 DOB/AGE: 56/17/1962 56 y.o.  Admit date: 11/21/2017 Discharge date: 11/24/2017  Admission Diagnoses:  Active Problems:   Total knee replacement status   Discharge Diagnoses:  Same  Past Medical History:  Diagnosis Date  . Arthritis    primary localized osteoarthritis B/L knees  . Diverticulosis   . Gout   . Headache   . HTN (hypertension)   . Obesity   . Wears glasses     Surgeries: Procedure(s): LEFT TOTAL KNEE ARTHROPLASTY on 11/21/2017   Consultants:   Discharged Condition: Improved  Hospital Course: Ian Wilson is an 57 y.o. male who was admitted 11/21/2017 for operative treatment of<principal problem not specified>. Patient has severe unremitting pain that affects sleep, daily activities, and work/hobbies. After pre-op clearance the patient was taken to the operating room on 11/21/2017 and underwent  Procedure(s): LEFT TOTAL KNEE ARTHROPLASTY.    Patient was given perioperative antibiotics:  Anti-infectives (From admission, onward)   Start     Dose/Rate Route Frequency Ordered Stop   11/21/17 2200  ceFAZolin (ANCEF) IVPB 2g/100 mL premix     2 g 200 mL/hr over 30 Minutes Intravenous Every 8 hours 11/21/17 1718 11/22/17 1417   11/21/17 1557  vancomycin (VANCOCIN) powder  Status:  Discontinued       As needed 11/21/17 1558 11/21/17 1644   11/21/17 1300  ceFAZolin (ANCEF) IVPB 2g/100 mL premix     2 g 200 mL/hr over 30 Minutes Intravenous On call to O.R. 11/21/17 1025 11/21/17 1415   11/21/17 1031  ceFAZolin (ANCEF) 2-4 GM/100ML-% IVPB    Comments:  Domenica FailJones, Tomika   : cabinet override      11/21/17 1031 11/21/17 1415       Patient was given sequential compression devices, early ambulation, and chemoprophylaxis to prevent DVT.  Patient benefited maximally from hospital stay and there were no complications.    Recent vital signs:  Patient Vitals for the past 24 hrs:  BP Temp Temp src Pulse Resp SpO2  11/24/17  0408 (!) 147/90 98.4 F (36.9 C) Oral 98 - 100 %  11/23/17 1948 (!) 162/88 98.3 F (36.8 C) Oral 97 - 98 %  11/23/17 1346 (!) 146/91 98.6 F (37 C) Oral 97 18 99 %     Recent laboratory studies:  Recent Labs    11/22/17 0657  WBC 7.8  HGB 12.7*  HCT 40.2  PLT 208  NA 138  K 4.0  CL 101  CO2 25  BUN 16  CREATININE 1.28*  GLUCOSE 118*  CALCIUM 7.7*     Discharge Medications:   Allergies as of 11/24/2017      Reactions   Bee Venom Anaphylaxis   Peanut Oil Anaphylaxis   Shellfish-derived Products Anaphylaxis      Medication List    TAKE these medications   allopurinol 300 MG tablet Commonly known as:  ZYLOPRIM Take 1 tablet (300 mg total) daily by mouth.   aspirin EC 325 MG tablet Take 1 tablet (325 mg total) by mouth 2 (two) times daily.   CAMPHOR-MENTHOL EX Apply 1 application topically 2 (two) times daily as needed (for knee pain.). Pain-A-Trate Extra Strength Pain Relieving Cream   colchicine 0.6 MG tablet Take 0.6 mg by mouth 2 (two) times daily.   ibuprofen 200 MG tablet Commonly known as:  ADVIL,MOTRIN Take 600 mg by mouth every 6 (six) hours as needed (for pain.).   methocarbamol 750 MG tablet Commonly known as:  ROBAXIN  Take 1 tablet (750 mg total) by mouth 2 (two) times daily as needed for muscle spasms.   ondansetron 4 MG tablet Commonly known as:  ZOFRAN Take 1-2 tablets (4-8 mg total) by mouth every 8 (eight) hours as needed for nausea or vomiting.   oxyCODONE 10 mg 12 hr tablet Commonly known as:  OXYCONTIN Take 1 tablet (10 mg total) by mouth every 12 (twelve) hours.   oxyCODONE 5 MG immediate release tablet Commonly known as:  Oxy IR/ROXICODONE Take 1-3 tablets (5-15 mg total) by mouth every 4 (four) hours as needed.   predniSONE 20 MG tablet Commonly known as:  DELTASONE Take 20 mg by mouth daily with breakfast. Take 2  Tablets by mouth every day until pain relieved, reserve the remainder.   promethazine 25 MG tablet Commonly  known as:  PHENERGAN Take 1 tablet (25 mg total) by mouth every 6 (six) hours as needed for nausea.   senna-docusate 8.6-50 MG tablet Commonly known as:  SENOKOT S Take 1 tablet by mouth at bedtime as needed.            Durable Medical Equipment  (From admission, onward)        Start     Ordered   11/22/17 1751  For home use only DME 3 n 1  Once     11/22/17 1751   11/21/17 1719  DME Walker rolling  Once    Question:  Patient needs a walker to treat with the following condition  Answer:  Total knee replacement status   11/21/17 1718   11/21/17 1719  DME 3 n 1  Once     11/21/17 1718   11/21/17 1719  DME Bedside commode  Once    Question:  Patient needs a bedside commode to treat with the following condition  Answer:  Total knee replacement status   11/21/17 1718      Diagnostic Studies: Dg Chest 2 View  Result Date: 11/14/2017 CLINICAL DATA:  Preoperative for left knee surgery EXAM: CHEST  2 VIEW COMPARISON:  None. FINDINGS: Normal heart size. Normal mediastinal contour. No pneumothorax. No pleural effusion. Mild linear scarring versus atelectasis at the right lung base. Otherwise clear lungs with no pulmonary edema. IMPRESSION: Mild scarring versus atelectasis at the anterior right lung base. Otherwise no active disease in chest. Electronically Signed   By: Delbert Phenix M.D.   On: 11/14/2017 08:05   Dg Knee Left Port  Result Date: 11/21/2017 CLINICAL DATA:  Status post left knee replacement. EXAM: PORTABLE LEFT KNEE - 1-2 VIEW COMPARISON:  07/23/2016. FINDINGS: Interval left total knee prosthesis in satisfactory position and alignment. No fracture or dislocation. IMPRESSION: Satisfactory postoperative appearance of a left total knee prosthesis. Electronically Signed   By: Beckie Salts M.D.   On: 11/21/2017 17:09    Disposition: 01-Home or Self Care    Follow-up Information    Tarry Kos, MD Follow up in 2 week(s).   Specialty:  Orthopedic Surgery Why:  For suture  removal, For wound re-check Contact information: [address] [place] [address] [phone]        [facility] Follow up.   Specialty:  Home Health Services Why:  Home Health Physical Therapy- agency will call to arrange initial visit Contact information: [address] [place] [address] [place] [address] [phone]            Signed: Kathryne Hitch 11/24/2017, 8:43 AM

## 2017-11-25 ENCOUNTER — Other Ambulatory Visit (INDEPENDENT_AMBULATORY_CARE_PROVIDER_SITE_OTHER): Payer: Self-pay | Admitting: Orthopaedic Surgery

## 2017-11-25 NOTE — Telephone Encounter (Signed)
Ok to refill 

## 2017-11-26 ENCOUNTER — Telehealth (INDEPENDENT_AMBULATORY_CARE_PROVIDER_SITE_OTHER): Payer: Self-pay | Admitting: Orthopaedic Surgery

## 2017-11-26 DIAGNOSIS — Z471 Aftercare following joint replacement surgery: Secondary | ICD-10-CM | POA: Diagnosis not present

## 2017-11-26 DIAGNOSIS — M1711 Unilateral primary osteoarthritis, right knee: Secondary | ICD-10-CM | POA: Diagnosis not present

## 2017-11-26 DIAGNOSIS — I10 Essential (primary) hypertension: Secondary | ICD-10-CM | POA: Diagnosis not present

## 2017-11-26 NOTE — Telephone Encounter (Signed)
Keane PoliceChristopher Williams with [facility] request verbal orders for Physical Therapy 3 times a week for 2 weeks. Cristal DeerChristopher call back number [phone]

## 2017-11-27 NOTE — Telephone Encounter (Signed)
IC Cristal DeerChristopher and advised orders ok. LMVM

## 2017-11-28 ENCOUNTER — Telehealth (INDEPENDENT_AMBULATORY_CARE_PROVIDER_SITE_OTHER): Payer: Self-pay | Admitting: Radiology

## 2017-11-28 DIAGNOSIS — I10 Essential (primary) hypertension: Secondary | ICD-10-CM | POA: Diagnosis not present

## 2017-11-28 DIAGNOSIS — M1711 Unilateral primary osteoarthritis, right knee: Secondary | ICD-10-CM | POA: Diagnosis not present

## 2017-11-28 DIAGNOSIS — Z471 Aftercare following joint replacement surgery: Secondary | ICD-10-CM | POA: Diagnosis not present

## 2017-11-28 NOTE — Telephone Encounter (Signed)
Ok, have him take tylenol and use the incentive spirometer aggressively.  10x an hour

## 2017-11-28 NOTE — Telephone Encounter (Signed)
Ian Wilson called and wanted to tell you that patient has a temp of 101.3.  No redness, aquacel bandage still intact with residual drainage from postop as expected, vitals good. Just FYI on the fever, let me know if we need to do anything? She will keep us posted as well.

## 2017-11-29 NOTE — Telephone Encounter (Signed)
IC patient and LMVM to call me back.  

## 2017-11-29 NOTE — Telephone Encounter (Signed)
Patient called back and I advised, he understands

## 2017-11-30 DIAGNOSIS — Z471 Aftercare following joint replacement surgery: Secondary | ICD-10-CM | POA: Diagnosis not present

## 2017-11-30 DIAGNOSIS — I10 Essential (primary) hypertension: Secondary | ICD-10-CM | POA: Diagnosis not present

## 2017-11-30 DIAGNOSIS — M1711 Unilateral primary osteoarthritis, right knee: Secondary | ICD-10-CM | POA: Diagnosis not present

## 2017-12-02 DIAGNOSIS — I10 Essential (primary) hypertension: Secondary | ICD-10-CM | POA: Diagnosis not present

## 2017-12-02 DIAGNOSIS — M1711 Unilateral primary osteoarthritis, right knee: Secondary | ICD-10-CM | POA: Diagnosis not present

## 2017-12-02 DIAGNOSIS — Z471 Aftercare following joint replacement surgery: Secondary | ICD-10-CM | POA: Diagnosis not present

## 2017-12-03 ENCOUNTER — Telehealth (INDEPENDENT_AMBULATORY_CARE_PROVIDER_SITE_OTHER): Payer: Self-pay | Admitting: Orthopaedic Surgery

## 2017-12-03 NOTE — Telephone Encounter (Signed)
Patient's wife Leta Jungling(Marcia) called advised the pharmacy will need prior auth for (Oxycontin). She advised the insurance company is needing auth from the doctor. Leta JunglingMarcia advised patient will need out patient (PT)    [facility] will end on Friday. The number to contact Leta JunglingMarcia is [phone]

## 2017-12-04 ENCOUNTER — Telehealth (INDEPENDENT_AMBULATORY_CARE_PROVIDER_SITE_OTHER): Payer: Self-pay | Admitting: Orthopaedic Surgery

## 2017-12-04 DIAGNOSIS — Z471 Aftercare following joint replacement surgery: Secondary | ICD-10-CM | POA: Diagnosis not present

## 2017-12-04 DIAGNOSIS — I10 Essential (primary) hypertension: Secondary | ICD-10-CM | POA: Diagnosis not present

## 2017-12-04 DIAGNOSIS — M1711 Unilateral primary osteoarthritis, right knee: Secondary | ICD-10-CM | POA: Diagnosis not present

## 2017-12-04 NOTE — Telephone Encounter (Signed)
Called Pharmacy and they have the incorrect fax number for us. I gave him correct fax number and he will be faxing now.

## 2017-12-04 NOTE — Telephone Encounter (Signed)
See message below °

## 2017-12-04 NOTE — Telephone Encounter (Signed)
I will bring you the PT rx.  Can you fax?

## 2017-12-04 NOTE — Telephone Encounter (Signed)
[facility]   Pt wife called and would like our office to send over the referral for pt therapy. [facility] will stop in house therapy on Friday.  I did advise pt wife that Dr.Xu will be back in the office tomorrow.    [address] [place]  [address]  [phone]

## 2017-12-04 NOTE — Telephone Encounter (Signed)
I tried to fax this in debbie's office and the fax is not working. I put on your desk for scan and fax.

## 2017-12-05 NOTE — Telephone Encounter (Signed)
Faxed PT form to the [place] location as requested. Still pending on PA for RX (oxycontin). Patient's wife aware.

## 2017-12-06 ENCOUNTER — Encounter (INDEPENDENT_AMBULATORY_CARE_PROVIDER_SITE_OTHER): Payer: Self-pay | Admitting: Orthopaedic Surgery

## 2017-12-06 ENCOUNTER — Ambulatory Visit (INDEPENDENT_AMBULATORY_CARE_PROVIDER_SITE_OTHER): Payer: Commercial Managed Care - PPO | Admitting: Orthopaedic Surgery

## 2017-12-06 DIAGNOSIS — M1711 Unilateral primary osteoarthritis, right knee: Secondary | ICD-10-CM | POA: Diagnosis not present

## 2017-12-06 DIAGNOSIS — I10 Essential (primary) hypertension: Secondary | ICD-10-CM | POA: Diagnosis not present

## 2017-12-06 DIAGNOSIS — Z96652 Presence of left artificial knee joint: Secondary | ICD-10-CM

## 2017-12-06 DIAGNOSIS — Z471 Aftercare following joint replacement surgery: Secondary | ICD-10-CM | POA: Diagnosis not present

## 2017-12-06 MED ORDER — OXYCODONE HCL 5 MG PO TABS: 5.0000 mg | ORAL_TABLET | Freq: Three times a day (TID) | ORAL | 0 refills | Status: DC | PRN

## 2017-12-06 NOTE — Progress Notes (Signed)
Ian Wilson is two-week status post left total knee replacement.  Overall he is doing very well and progressing with home physical therapy.  He is able to achieve 92 degrees of flexion.  His incision is healed without any signs of infection or drainage.  There is expected postoperative swelling.  No calf tenderness.  The surgical dressings were removed today.  At this point we will refer [facility] to [facility] for [facility].  He no longer has had any low-grade fever.  Oxycodone was refilled today.  Questions encouraged and answered.  Continue aspirin for DVT prophylaxis.  Follow-up in 4 weeks with three-view x-rays of the left knee.

## 2017-12-09 ENCOUNTER — Telehealth (INDEPENDENT_AMBULATORY_CARE_PROVIDER_SITE_OTHER): Payer: Self-pay | Admitting: Orthopaedic Surgery

## 2017-12-09 DIAGNOSIS — Z471 Aftercare following joint replacement surgery: Secondary | ICD-10-CM | POA: Diagnosis not present

## 2017-12-09 DIAGNOSIS — M1711 Unilateral primary osteoarthritis, right knee: Secondary | ICD-10-CM | POA: Diagnosis not present

## 2017-12-09 DIAGNOSIS — I10 Essential (primary) hypertension: Secondary | ICD-10-CM | POA: Diagnosis not present

## 2017-12-09 NOTE — Telephone Encounter (Signed)
Called Leta JunglingMarcia back she will pick up new card.

## 2017-12-09 NOTE — Telephone Encounter (Signed)
Pt wife called and would like to receive a new card for travel. Pt wife stated the card is wet. Pt need the card that shows a pic of Knee replacement.

## 2017-12-09 NOTE — Telephone Encounter (Signed)
See message below,

## 2017-12-09 NOTE — Telephone Encounter (Signed)
sure

## 2017-12-11 DIAGNOSIS — M1711 Unilateral primary osteoarthritis, right knee: Secondary | ICD-10-CM | POA: Diagnosis not present

## 2017-12-11 DIAGNOSIS — Z471 Aftercare following joint replacement surgery: Secondary | ICD-10-CM | POA: Diagnosis not present

## 2017-12-11 DIAGNOSIS — I10 Essential (primary) hypertension: Secondary | ICD-10-CM | POA: Diagnosis not present

## 2017-12-16 DIAGNOSIS — M25661 Stiffness of right knee, not elsewhere classified: Secondary | ICD-10-CM | POA: Diagnosis not present

## 2017-12-16 DIAGNOSIS — R262 Difficulty in walking, not elsewhere classified: Secondary | ICD-10-CM | POA: Diagnosis not present

## 2017-12-16 DIAGNOSIS — M25561 Pain in right knee: Secondary | ICD-10-CM | POA: Diagnosis not present

## 2017-12-18 DIAGNOSIS — R262 Difficulty in walking, not elsewhere classified: Secondary | ICD-10-CM | POA: Diagnosis not present

## 2017-12-18 DIAGNOSIS — M25561 Pain in right knee: Secondary | ICD-10-CM | POA: Diagnosis not present

## 2017-12-18 DIAGNOSIS — M25661 Stiffness of right knee, not elsewhere classified: Secondary | ICD-10-CM | POA: Diagnosis not present

## 2017-12-24 DIAGNOSIS — M25561 Pain in right knee: Secondary | ICD-10-CM | POA: Diagnosis not present

## 2017-12-24 DIAGNOSIS — M25661 Stiffness of right knee, not elsewhere classified: Secondary | ICD-10-CM | POA: Diagnosis not present

## 2017-12-24 DIAGNOSIS — R262 Difficulty in walking, not elsewhere classified: Secondary | ICD-10-CM | POA: Diagnosis not present

## 2017-12-26 DIAGNOSIS — M25561 Pain in right knee: Secondary | ICD-10-CM | POA: Diagnosis not present

## 2017-12-26 DIAGNOSIS — R262 Difficulty in walking, not elsewhere classified: Secondary | ICD-10-CM | POA: Diagnosis not present

## 2017-12-26 DIAGNOSIS — M25661 Stiffness of right knee, not elsewhere classified: Secondary | ICD-10-CM | POA: Diagnosis not present

## 2017-12-31 DIAGNOSIS — M25561 Pain in right knee: Secondary | ICD-10-CM | POA: Diagnosis not present

## 2017-12-31 DIAGNOSIS — R262 Difficulty in walking, not elsewhere classified: Secondary | ICD-10-CM | POA: Diagnosis not present

## 2017-12-31 DIAGNOSIS — M25661 Stiffness of right knee, not elsewhere classified: Secondary | ICD-10-CM | POA: Diagnosis not present

## 2018-01-02 DIAGNOSIS — M25661 Stiffness of right knee, not elsewhere classified: Secondary | ICD-10-CM | POA: Diagnosis not present

## 2018-01-02 DIAGNOSIS — M25561 Pain in right knee: Secondary | ICD-10-CM | POA: Diagnosis not present

## 2018-01-02 DIAGNOSIS — R262 Difficulty in walking, not elsewhere classified: Secondary | ICD-10-CM | POA: Diagnosis not present

## 2018-01-03 ENCOUNTER — Ambulatory Visit (INDEPENDENT_AMBULATORY_CARE_PROVIDER_SITE_OTHER): Payer: Commercial Managed Care - PPO | Admitting: Orthopaedic Surgery

## 2018-01-03 ENCOUNTER — Ambulatory Visit (INDEPENDENT_AMBULATORY_CARE_PROVIDER_SITE_OTHER): Payer: Commercial Managed Care - PPO

## 2018-01-03 ENCOUNTER — Encounter (INDEPENDENT_AMBULATORY_CARE_PROVIDER_SITE_OTHER): Payer: Self-pay | Admitting: Orthopaedic Surgery

## 2018-01-03 DIAGNOSIS — Z96652 Presence of left artificial knee joint: Secondary | ICD-10-CM | POA: Diagnosis not present

## 2018-01-03 MED ORDER — HYDROCODONE-ACETAMINOPHEN 5-325 MG PO TABS: 1.0000 | ORAL_TABLET | Freq: Two times a day (BID) | ORAL | 0 refills | Status: DC | PRN

## 2018-01-03 NOTE — Progress Notes (Signed)
   Post-Op Visit Note   Patient: Ian SiresGeorge D Wilson           Date of Birth: Jan 21, 1961           MRN: [id number] Visit Date: 01/03/2018 PCP: Wanda PlumpPaz, Jose E, MD   Assessment & Plan:  Chief Complaint:  Chief Complaint  Patient presents with  . Left Knee - Pain, Follow-up   Visit Diagnoses:  1. S/P TKR (total knee replacement), left     Plan: Ian StallionGeorge comes in for follow-up.  43 days status post left total knee replaced, date of surgery 11/21/2017.  He has been doing excellent.  He has some pain with outpatient physical therapy but nothing more.  No fevers chills or any other systemic symptoms.  Emanation of the left knee reveals a well healed surgical incision without evidence of infection.  No calf tenderness.  He is stable valgus varus stress.  Range of motion 1-105  degrees.  He is neurovascularly intact distally.  At this point, we will have him continue with outpatient physical therapy to gain more motion and strength.  Follow-up with us in 6 weeks time or we anticipate his return to work full duty.  He will call with concerns or questions in the meantime.  Follow-Up Instructions: Return in about 6 weeks (around 02/14/2018).   Orders:  Orders Placed This Encounter  Procedures  . XR KNEE 3 VIEW LEFT   Meds ordered this encounter  Medications  . HYDROcodone-acetaminophen (NORCO) 5-325 MG tablet    Sig: Take 1-2 tablets by mouth 2 (two) times daily as needed for moderate pain.    Dispense:  30 tablet    Refill:  0    Imaging: Xr Knee 3 View Left  Result Date: 01/03/2018 Stable total knee replacement without complication   PMFS History: Patient Active Problem List   Diagnosis Date Noted  . S/P TKR (total knee replacement), left 11/21/2017  . PCP NOTES >>>>>>>>>>>>>>>>> 06/19/2016  . Diverticulitis of intestine without perforation or abscess without bleeding 08/02/2015  . Ankle pain 12/16/2014  . Diarrhea, Intermittent, lactose intolerance ? 04/13/2014  . Annual physical exam  12/25/2013  . DJD (degenerative joint disease) 11/18/2013  . Gout 10/29/2012  . HTN (hypertension)    Past Medical History:  Diagnosis Date  . Arthritis    primary localized osteoarthritis B/L knees  . Diverticulosis   . Gout   . Headache   . HTN (hypertension)   . Obesity   . Wears glasses     Family History  Problem Relation Age of Onset  . CAD Mother        CABG in her 4950s  . Stroke Mother   . Diabetes Mother   . Prostate cancer Father        dx in his 6070s  . Colon cancer Neg Hx     Past Surgical History:  Procedure Laterality Date  . COLONOSCOPY    . NO PAST SURGERIES    . TOTAL KNEE ARTHROPLASTY Left 11/21/2017   Procedure: LEFT TOTAL KNEE ARTHROPLASTY;  Surgeon: Tarry KosXu, Naiping M, MD;  Location: [facility] OR;  Service: Orthopedics;  Laterality: Left;   Social History   Occupational History  . Occupation: Airline pilotsales    Tobacco Use  . Smoking status: Never Smoker  . Smokeless tobacco: Never Used  Substance and Sexual Activity  . Alcohol use: No  . Drug use: No  . Sexual activity: Not on file

## 2018-01-07 DIAGNOSIS — R262 Difficulty in walking, not elsewhere classified: Secondary | ICD-10-CM | POA: Diagnosis not present

## 2018-01-07 DIAGNOSIS — M25561 Pain in right knee: Secondary | ICD-10-CM | POA: Diagnosis not present

## 2018-01-07 DIAGNOSIS — M25661 Stiffness of right knee, not elsewhere classified: Secondary | ICD-10-CM | POA: Diagnosis not present

## 2018-01-09 DIAGNOSIS — M25561 Pain in right knee: Secondary | ICD-10-CM | POA: Diagnosis not present

## 2018-01-09 DIAGNOSIS — M25661 Stiffness of right knee, not elsewhere classified: Secondary | ICD-10-CM | POA: Diagnosis not present

## 2018-01-09 DIAGNOSIS — R262 Difficulty in walking, not elsewhere classified: Secondary | ICD-10-CM | POA: Diagnosis not present

## 2018-01-14 DIAGNOSIS — Z760 Encounter for issue of repeat prescription: Secondary | ICD-10-CM | POA: Diagnosis not present

## 2018-01-21 DIAGNOSIS — M25661 Stiffness of right knee, not elsewhere classified: Secondary | ICD-10-CM | POA: Diagnosis not present

## 2018-01-21 DIAGNOSIS — R262 Difficulty in walking, not elsewhere classified: Secondary | ICD-10-CM | POA: Diagnosis not present

## 2018-01-21 DIAGNOSIS — M25561 Pain in right knee: Secondary | ICD-10-CM | POA: Diagnosis not present

## 2018-01-23 DIAGNOSIS — M25561 Pain in right knee: Secondary | ICD-10-CM | POA: Diagnosis not present

## 2018-01-23 DIAGNOSIS — M25661 Stiffness of right knee, not elsewhere classified: Secondary | ICD-10-CM | POA: Diagnosis not present

## 2018-01-23 DIAGNOSIS — R262 Difficulty in walking, not elsewhere classified: Secondary | ICD-10-CM | POA: Diagnosis not present

## 2018-01-28 DIAGNOSIS — R262 Difficulty in walking, not elsewhere classified: Secondary | ICD-10-CM | POA: Diagnosis not present

## 2018-01-28 DIAGNOSIS — M25661 Stiffness of right knee, not elsewhere classified: Secondary | ICD-10-CM | POA: Diagnosis not present

## 2018-01-28 DIAGNOSIS — M25561 Pain in right knee: Secondary | ICD-10-CM | POA: Diagnosis not present

## 2018-01-30 DIAGNOSIS — R262 Difficulty in walking, not elsewhere classified: Secondary | ICD-10-CM | POA: Diagnosis not present

## 2018-01-30 DIAGNOSIS — M25561 Pain in right knee: Secondary | ICD-10-CM | POA: Diagnosis not present

## 2018-01-30 DIAGNOSIS — M25661 Stiffness of right knee, not elsewhere classified: Secondary | ICD-10-CM | POA: Diagnosis not present

## 2018-02-04 DIAGNOSIS — M25661 Stiffness of right knee, not elsewhere classified: Secondary | ICD-10-CM | POA: Diagnosis not present

## 2018-02-04 DIAGNOSIS — M25561 Pain in right knee: Secondary | ICD-10-CM | POA: Diagnosis not present

## 2018-02-04 DIAGNOSIS — R262 Difficulty in walking, not elsewhere classified: Secondary | ICD-10-CM | POA: Diagnosis not present

## 2018-02-06 DIAGNOSIS — M25561 Pain in right knee: Secondary | ICD-10-CM | POA: Diagnosis not present

## 2018-02-06 DIAGNOSIS — R262 Difficulty in walking, not elsewhere classified: Secondary | ICD-10-CM | POA: Diagnosis not present

## 2018-02-06 DIAGNOSIS — M25661 Stiffness of right knee, not elsewhere classified: Secondary | ICD-10-CM | POA: Diagnosis not present

## 2018-02-10 ENCOUNTER — Encounter (INDEPENDENT_AMBULATORY_CARE_PROVIDER_SITE_OTHER): Payer: Self-pay | Admitting: Orthopaedic Surgery

## 2018-02-10 ENCOUNTER — Ambulatory Visit (INDEPENDENT_AMBULATORY_CARE_PROVIDER_SITE_OTHER): Payer: Commercial Managed Care - PPO | Admitting: Orthopaedic Surgery

## 2018-02-10 DIAGNOSIS — Z96652 Presence of left artificial knee joint: Secondary | ICD-10-CM

## 2018-02-10 MED ORDER — HYDROCODONE-ACETAMINOPHEN 5-325 MG PO TABS: 1.0000 | ORAL_TABLET | Freq: Every day | ORAL | 0 refills | Status: DC | PRN

## 2018-02-10 NOTE — Progress Notes (Signed)
   Post-Op Visit Note   Patient: Ian Wilson           Date of Birth: 10-06-1961           MRN: [id number] Visit Date: 02/10/2018 PCP: Wanda Plump, MD   Assessment & Plan:  Chief Complaint:  Chief Complaint  Patient presents with  . Left Knee - Routine Post Op   Visit Diagnoses:  1. Presence of left artificial knee joint     Plan: 3 month TKA follow up plan  Patient now 3 months status post left total knee arthroplasty. Wound is healed with no signs of complications or infection.  The patient does not complain of pain, and is back to normal daily activities.  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  It was reinforced that prophylactic antibiotics should be taken with any procedure including but not limited to dental work or colonoscopies.  We will plan on following up at the 6 month postop visit with 2 view xrays of the operative knee at that time. As always, instructions were given to call with any questions or concerns in the interim.  He is walking with a cane because of his right knee DJD.     Follow-Up Instructions: Return in about 3 months (around 05/12/2018).   Orders:  No orders of the defined types were placed in this encounter.  Meds ordered this encounter  Medications  . HYDROcodone-acetaminophen (NORCO) 5-325 MG tablet    Sig: Take 1 tablet by mouth daily as needed.    Dispense:  20 tablet    Refill:  0    Imaging: No results found.  PMFS History: Patient Active Problem List   Diagnosis Date Noted  . Presence of left artificial knee joint 02/10/2018  . S/P TKR (total knee replacement), left 11/21/2017  . PCP NOTES >>>>>>>>>>>>>>>>> 06/19/2016  . Diverticulitis of intestine without perforation or abscess without bleeding 08/02/2015  . Ankle pain 12/16/2014  . Diarrhea, Intermittent, lactose intolerance ? 04/13/2014  . Annual physical exam 12/25/2013  . DJD (degenerative joint disease) 11/18/2013  . Gout 10/29/2012  .  HTN (hypertension)    Past Medical History:  Diagnosis Date  . Arthritis    primary localized osteoarthritis B/L knees  . Diverticulosis   . Gout   . Headache   . HTN (hypertension)   . Obesity   . Wears glasses     Family History  Problem Relation Age of Onset  . CAD Mother        CABG in her 69s  . Stroke Mother   . Diabetes Mother   . Prostate cancer Father        dx in his 58s  . Colon cancer Neg Hx     Past Surgical History:  Procedure Laterality Date  . COLONOSCOPY    . NO PAST SURGERIES    . TOTAL KNEE ARTHROPLASTY Left 11/21/2017   Procedure: LEFT TOTAL KNEE ARTHROPLASTY;  Surgeon: Tarry Kos, MD;  Location: [facility];  Service: Orthopedics;  Laterality: Left;   Social History   Occupational History  . Occupation: Airline pilot    Tobacco Use  . Smoking status: Never Smoker  . Smokeless tobacco: Never Used  Substance and Sexual Activity  . Alcohol use: No  . Drug use: No  . Sexual activity: Not on file

## 2018-03-17 IMAGING — CT CT HEAD W/O CM
3 of 4 series · 14 of 47 positions shown, 16 images · non-contrast
Comparison: None.

CLINICAL DATA: Patient is complaining of a headache. Patient states
he has never felt a headache like this before. Patient states it
happened around [DATE]. Patient states EMS came and his blood
pressure was high

EXAM:
CT HEAD WITHOUT CONTRAST
TECHNIQUE: Contiguous axial images were obtained from the base of the skull
through the vertex without intravenous contrast.

[Series 2: head w/o · axial · non-contrast · 0.54mm/px · z∈[+1348,+1483]mm · 8 of 33 slices shown, 10 images]
[im 3/33  brain]
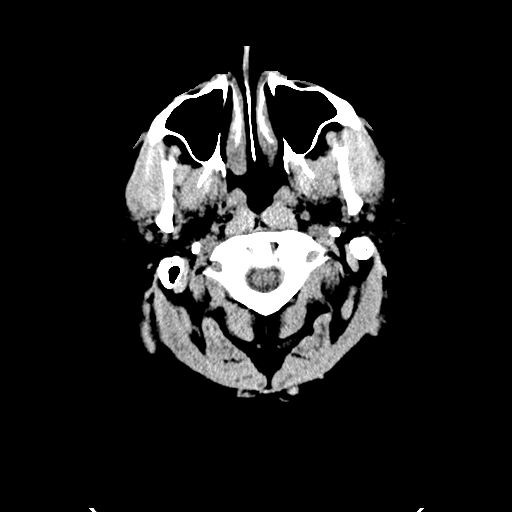
[im 3/33  bone]
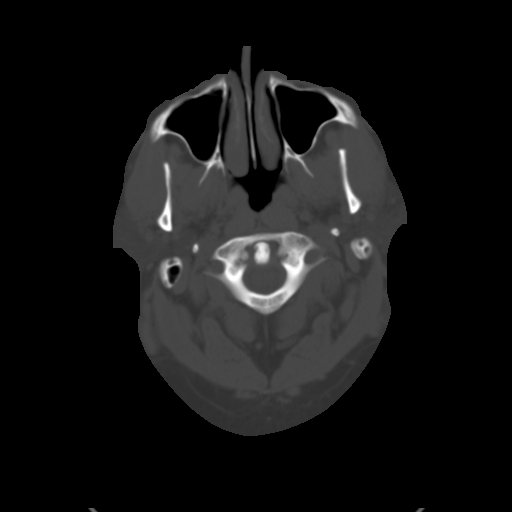
[im 7/33  brain]
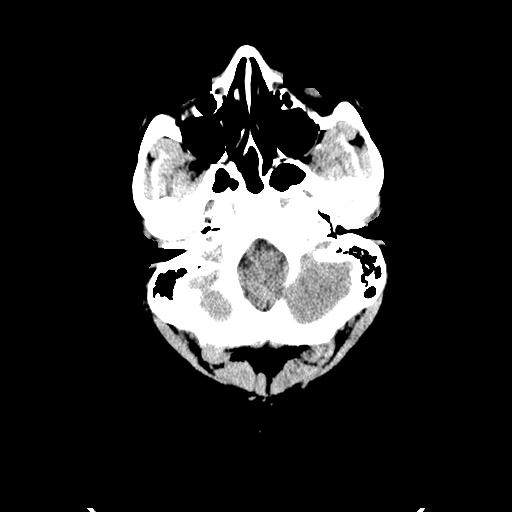
[im 12/33  brain]
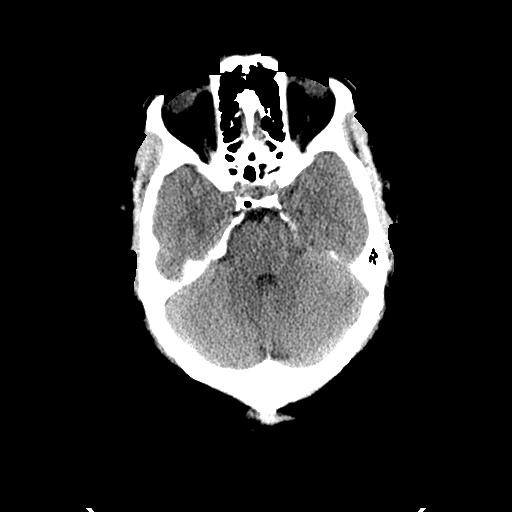
[im 14/33  brain]
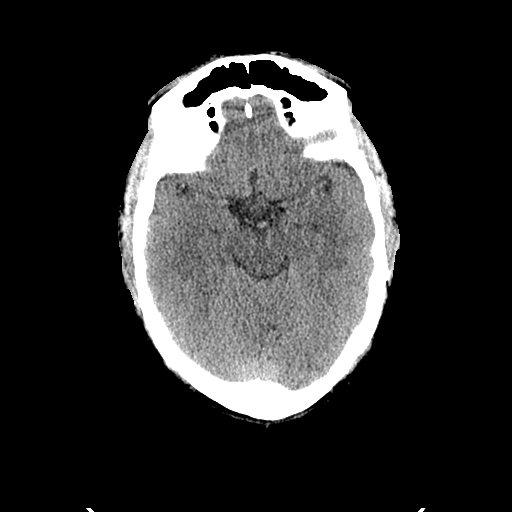
[im 19/33  brain]
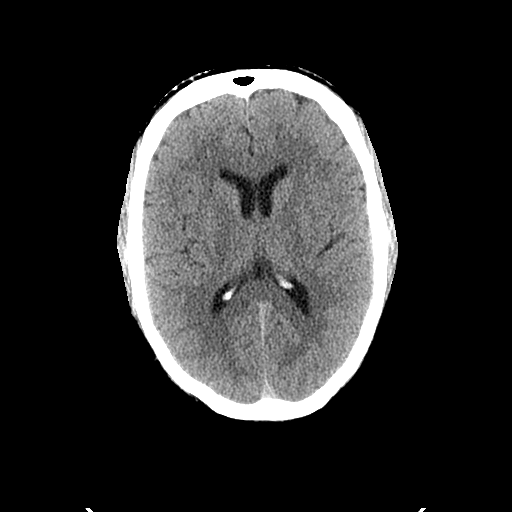
[im 19/33  bone]
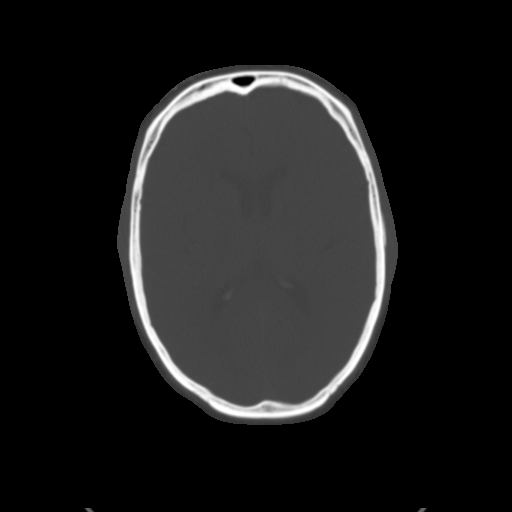
[im 21/33  brain]
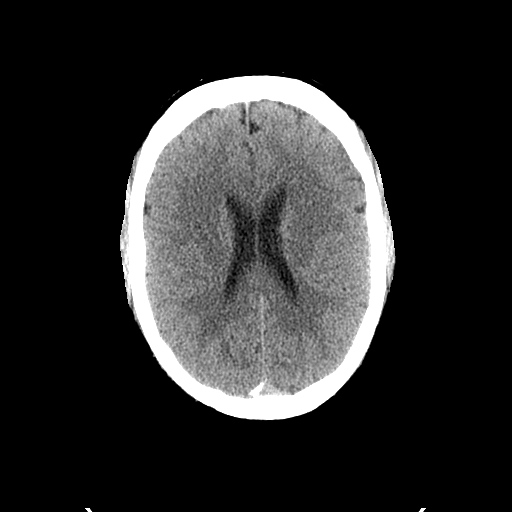
[im 26/33  brain]
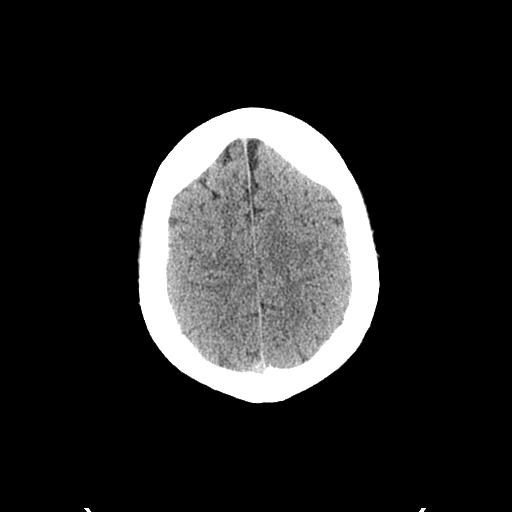
[im 30/33  brain]
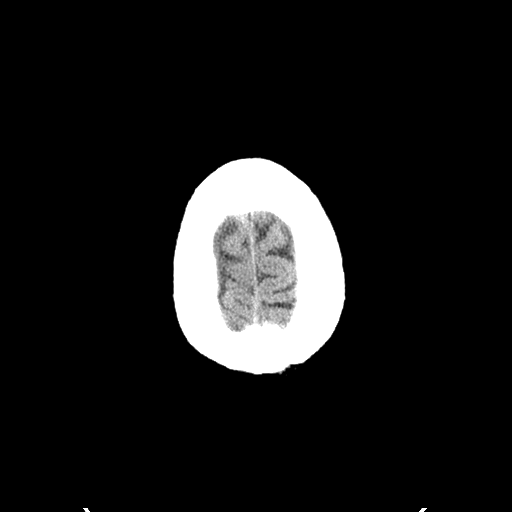

[Series 4: coronal · coronal · 0.32mm/px · 3 of 77 slices shown]
[im 26/77  brain]
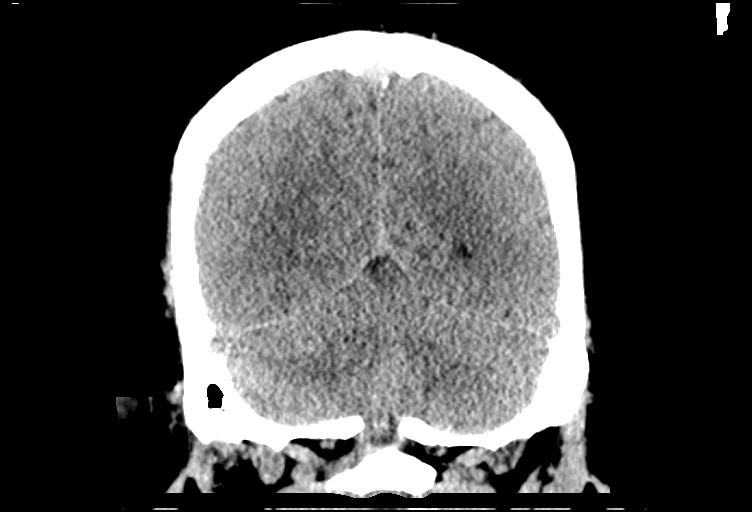
[im 34/77  brain]
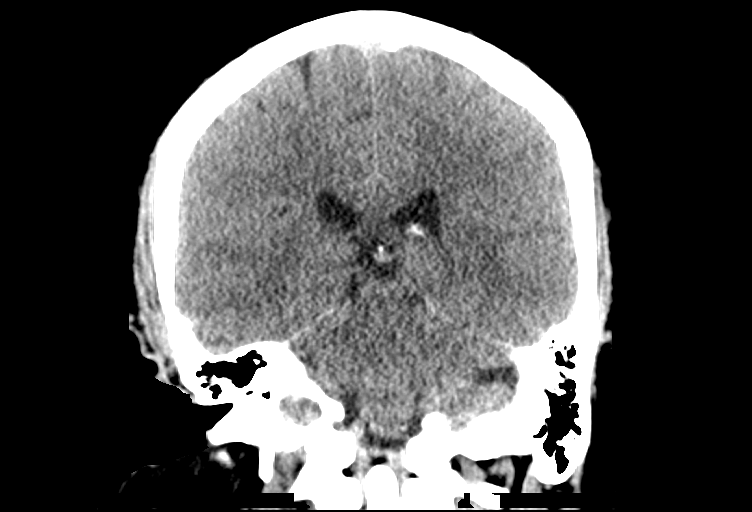
[im 43/77  brain]
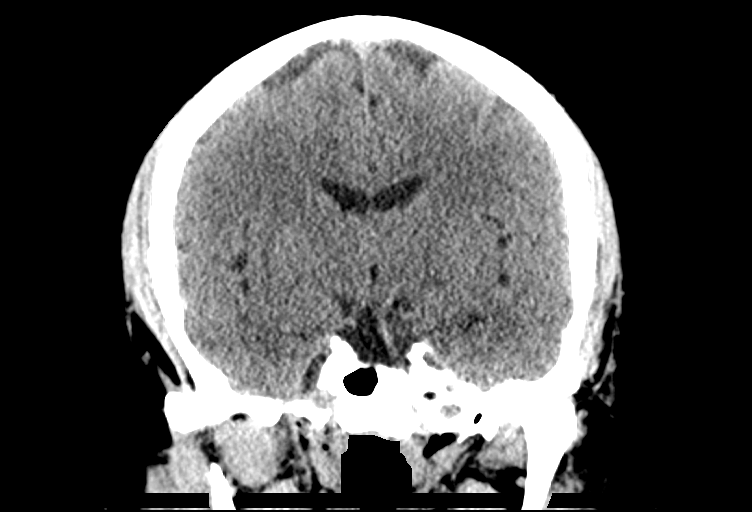

[Series 5: sagittal · sagittal · 0.32mm/px · 3 of 77 slices shown]
[im 26/77  brain]
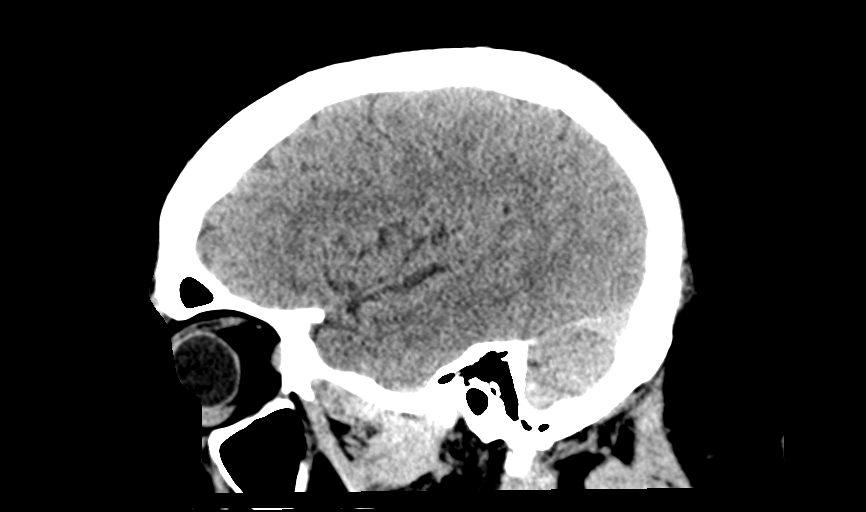
[im 39/77  brain]
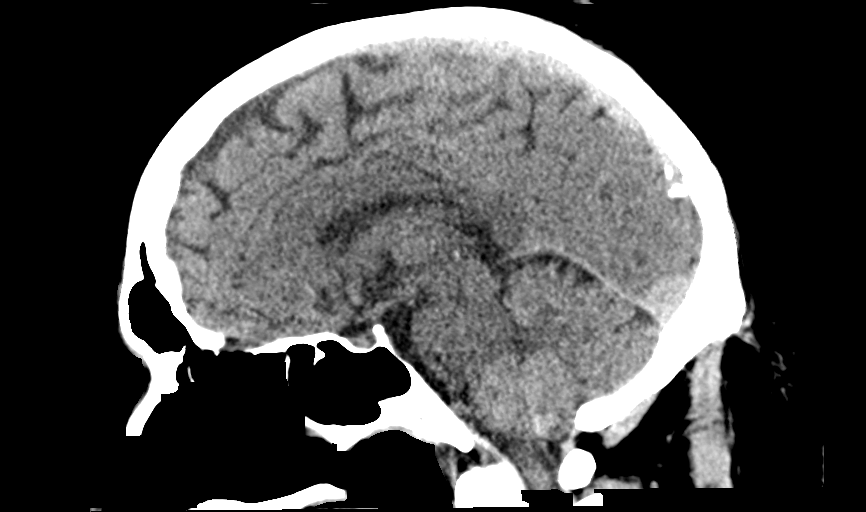
[im 51/77  brain]
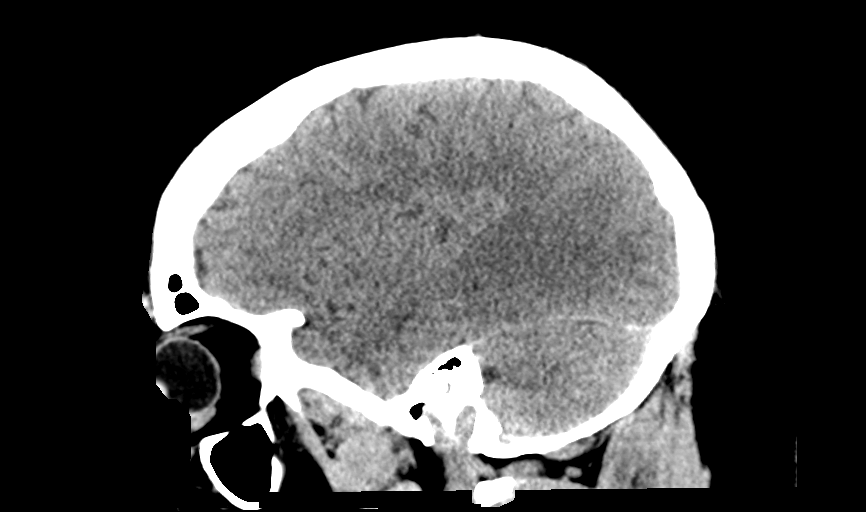

[14 of 47 positions shown; findings below may reference images not displayed]

FINDINGS: Brain: No evidence of acute infarction, hemorrhage, hydrocephalus,
extra-axial collection or mass lesion/mass effect.

Vascular: No hyperdense vessel or unexpected calcification.

Skull: Normal. Negative for fracture or focal lesion.

Sinuses/Orbits: Globes orbits are unremarkable. Visualized sinuses
and mastoid air cells are clear.

Other: None.
IMPRESSION: Normal unenhanced CT scan of the brain.

## 2018-05-13 ENCOUNTER — Ambulatory Visit (INDEPENDENT_AMBULATORY_CARE_PROVIDER_SITE_OTHER): Payer: Self-pay

## 2018-05-13 ENCOUNTER — Encounter (INDEPENDENT_AMBULATORY_CARE_PROVIDER_SITE_OTHER): Payer: Self-pay | Admitting: Orthopaedic Surgery

## 2018-05-13 ENCOUNTER — Ambulatory Visit (INDEPENDENT_AMBULATORY_CARE_PROVIDER_SITE_OTHER): Payer: Commercial Managed Care - PPO | Admitting: Orthopaedic Surgery

## 2018-05-13 DIAGNOSIS — Z96652 Presence of left artificial knee joint: Secondary | ICD-10-CM

## 2018-05-13 DIAGNOSIS — Z6841 Body Mass Index (BMI) 40.0 and over, adult: Secondary | ICD-10-CM | POA: Diagnosis not present

## 2018-05-13 MED ORDER — IBUPROFEN 800 MG PO TABS: ORAL_TABLET | ORAL | 1 refills | Status: DC

## 2018-05-13 NOTE — Progress Notes (Signed)
Post-Op Visit Note   Patient: Ian Wilson           Date of Birth: 06-23-1961           MRN: [id number] Visit Date: 05/13/2018 PCP: Wanda PlumpPaz, Jose E, MD   Assessment & Plan:  Chief Complaint:  Chief Complaint  Patient presents with  . Left Knee - Pain   Visit Diagnoses:  1. S/P TKR (total knee replacement), left   2. Morbid obesity (HCC)   3. Body mass index 40.0-44.9, adult [facility](HCC)     Plan: Patient is a pleasant 57 year old gentleman who presents to our clinic today 6 months status post left total knee replacement, date of surgery 11/21/2017.  Doing excellent.  He has regained full motion and strength.  He has no pain.  He now notices increased pain to the right knee.  History of osteoarthritis there and has failed conservative treatment to include cortisone injections in the past.  He is trying to wait until this February before proceeding with right total knee replacement.  He does note that he has gained weight following his left total knee replacement and is trying to work on losing this to be able to schedule a right total knee replacement.  Examination of the left knee has range of motion from 0 to 120 degrees.  Stable valgus and varus stress.  Neurovascularly intact distally.  In regards to the left knee, he will continue working on strengthening exercises.  He will follow-up with us in December of this year for recheck of the left total knee and to schedule a right total knee replacement.  In the meantime, he will work at weight loss as we have discussed the need to stay under a BMI of 40 which is where he is at right now. The patient meets the AMA guidelines for Morbid (severe) obesity with a BMI > 40.0 and I have recommended weight loss.   Follow-Up Instructions: Return in about 4 months (around 09/13/2018).   Orders:  Orders Placed This Encounter  Procedures  . XR KNEE 3 VIEW LEFT   No orders of the defined types were placed in this encounter.   Imaging: Xr Knee 3  View Left  Result Date: 05/13/2018 X-rays show well seated prosthesis without evidence of subsidence or osteolysis   PMFS History: Patient Active Problem List   Diagnosis Date Noted  . Morbid obesity (HCC) 05/13/2018  . Body mass index 40.0-44.9, adult (HCC) 05/13/2018  . Presence of left artificial knee joint 02/10/2018  . S/P TKR (total knee replacement), left 11/21/2017  . PCP NOTES >>>>>>>>>>>>>>>>> 06/19/2016  . Diverticulitis of intestine without perforation or abscess without bleeding 08/02/2015  . Ankle pain 12/16/2014  . Diarrhea, Intermittent, lactose intolerance ? 04/13/2014  . Annual physical exam 12/25/2013  . DJD (degenerative joint disease) 11/18/2013  . Gout 10/29/2012  . HTN (hypertension)    Past Medical History:  Diagnosis Date  . Arthritis    primary localized osteoarthritis B/L knees  . Diverticulosis   . Gout   . Headache   . HTN (hypertension)   . Obesity   . Wears glasses     Family History  Problem Relation Age of Onset  . CAD Mother        CABG in her 2750s  . Stroke Mother   . Diabetes Mother   . Prostate cancer Father        dx in his 470s  . Colon cancer Neg Hx  Past Surgical History:  Procedure Laterality Date  . COLONOSCOPY    . NO PAST SURGERIES    . TOTAL KNEE ARTHROPLASTY Left 11/21/2017   Procedure: LEFT TOTAL KNEE ARTHROPLASTY;  Surgeon: Tarry Kos, MD;  Location: [facility] OR;  Service: Orthopedics;  Laterality: Left;   Social History   Occupational History  . Occupation: Airline pilot    Tobacco Use  . Smoking status: Never Smoker  . Smokeless tobacco: Never Used  Substance and Sexual Activity  . Alcohol use: No  . Drug use: No  . Sexual activity: Not on file

## 2018-06-27 ENCOUNTER — Encounter: Payer: Commercial Managed Care - PPO | Admitting: Internal Medicine

## 2018-07-01 ENCOUNTER — Ambulatory Visit (INDEPENDENT_AMBULATORY_CARE_PROVIDER_SITE_OTHER): Payer: Commercial Managed Care - PPO | Admitting: Internal Medicine

## 2018-07-01 ENCOUNTER — Encounter: Payer: Self-pay | Admitting: Internal Medicine

## 2018-07-01 VITALS — BP 134/74 | HR 91 | Temp 98.1°F | Resp 16 | Ht 71.0 in | Wt 289.2 lb

## 2018-07-01 DIAGNOSIS — M1000 Idiopathic gout, unspecified site: Secondary | ICD-10-CM | POA: Diagnosis not present

## 2018-07-01 DIAGNOSIS — Z23 Encounter for immunization: Secondary | ICD-10-CM

## 2018-07-01 DIAGNOSIS — Z0000 Encounter for general adult medical examination without abnormal findings: Secondary | ICD-10-CM

## 2018-07-01 NOTE — Progress Notes (Signed)
Subjective:    Patient ID: Ian Wilson, male    DOB: [birth year], 57 y.o.   MRN: [id number]  DOS:  07/01/2018 Type of visit - description : cpx Interval history: No majors concerns   Review of Systems  Had a successful left knee replacement. Has developed pain in the right leg by the thigh near the right groin.  Decrease as he walks and becomes active. Occasionally has tingling at the left fifth finger, denies neck pain, elbow pain.  Symptoms are not consistent.  Other than above, a 14 point review of systems is negative      Past Medical History:  Diagnosis Date  . Arthritis    primary localized osteoarthritis B/L knees  . Diverticulosis   . Gout   . Headache   . HTN (hypertension)   . Obesity   . Wears glasses     Past Surgical History:  Procedure Laterality Date  . COLONOSCOPY    . TOTAL KNEE ARTHROPLASTY Left 11/21/2017   Procedure: LEFT TOTAL KNEE ARTHROPLASTY;  Surgeon: Tarry KosXu, Naiping M, MD;  Location: [facility] OR;  Service: Orthopedics;  Laterality: Left;    Social History   Socioeconomic History  . Marital status: Married    Spouse name: Not on file  . Number of children: 1  . Years of education: Not on file  . Highest education level: Not on file  Occupational History  . 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 Financial resource strain: Not on file  . Food insecurity:    Worry: Not on file    Inability: Not on file  . Transportation needs:    Medical: Not on file    Non-medical: Not on file  Tobacco Use  . Smoking status: Never Smoker  . Smokeless tobacco: Never Used  Substance and Sexual Activity  . Alcohol use: No  . Drug use: No  . Sexual activity: Not on file  Lifestyle  . Physical activity:    Days per week: Not on file    Minutes per session: Not on file  . Stress: Not on file  Relationships  . Social connections:    Talks on phone: Not on file    Gets together: Not on file    Attends religious service: Not on file    Active member of  club or organization: Not on file    Attends meetings of clubs or organizations: Not on file    Relationship status: Not on file  . Intimate partner violence:    Fear of current or ex partner: Not on file    Emotionally abused: Not on file    Physically abused: Not on file    Forced sexual activity: Not on file  Other Topics Concern  . Not on file  Social History Narrative   Lives w/ wife    Daughter [id number]     Family History  Problem Relation Age of Onset  . CAD Mother        CABG in her 7850s  . Stroke Mother   . Diabetes Mother   . Prostate cancer Father [phone]  . Colon cancer Neg Hx      Allergies as of 07/01/2018      Reactions   Bee Venom Anaphylaxis   Peanut Oil Anaphylaxis   Shellfish-derived Products Anaphylaxis      Medication List        Accurate as of 07/01/18 11:59 PM. Always use your most recent med list.  allopurinol 300 MG tablet Commonly known as:  ZYLOPRIM Take 1 tablet (300 mg total) daily by mouth.   colchicine 0.6 MG tablet Take 0.6 mg by mouth 2 (two) times daily.   ibuprofen 800 MG tablet Commonly known as:  ADVIL,MOTRIN Take one tab po with food bid prn pain          Objective:   Physical Exam BP 134/74 (BP Location: Left Arm, Patient Position: Sitting, Cuff Size: Normal)   Pulse 91   Temp 98.1 F (36.7 C) (Oral)   Resp 16   Ht 5\' 11"  (1.803 m)   Wt 289 lb 4 oz (131.2 kg)   SpO2 97%   BMI 40.34 kg/m  General: Well developed, NAD, see BMI.  Neck: No  thyromegaly  HEENT:  Normocephalic . Face symmetric, atraumatic Lungs:  CTA B Normal respiratory effort, no intercostal retractions, no accessory muscle use. Heart: RRR,  no murmur.  No pretibial edema bilaterally  Abdomen:  Not distended, soft, non-tender. No rebound or rigidity.  Inspection and palpation of the groins normal.  No hernias. Rectal: External abnormalities: none. Normal sphincter tone. No rectal masses or tenderness.  No stools Prostate: Prostate gland  firm and smooth, no enlargement, nodularity, tenderness, mass, asymmetry or induration Skin: Exposed areas without rash. Not pale. Not jaundice Neurologic:  alert & oriented X3.  Speech normal, gait appropriate for age and unassisted Strength symmetric and appropriate for age.  Psych: Cognition and judgment appear intact.  Cooperative with normal attention span and concentration.  Behavior appropriate. No anxious or depressed appearing.     Assessment & Plan:   Assessment HTN? Gout DJD, knees  H/o diverticulitis 2016 Allergies: peanut oil, shellfish On EpiPen  PLAN: Gout: On allopurinol, no symptoms.  Checking labs Pain, right leg as described above: Recommend to discuss with Ortho Occasional numbness, left fifth finger.  Recommend observation.  If symptoms persistent, recommend to discuss with Ortho.  Cubital tunnel syndrome? RTC 1 year

## 2018-07-01 NOTE — Patient Instructions (Signed)
   GO TO THE FRONT DESK Schedule labs to be done fasting this week.  Schedule your next appointment for a   sickle exam in 1 year

## 2018-07-01 NOTE — Progress Notes (Signed)
Pre visit review using our clinic review tool, if applicable. No additional management support is needed unless otherwise documented below in the visit note. 

## 2018-07-01 NOTE — Assessment & Plan Note (Addendum)
-  Tdap: 2019; rec a flu shot this fall --CCS: Had a cscope ([place])   ~ 2012, pt was told normal; I shared with the patient my concern that there is no documentation, encouraged him to get me the name of the physician who performed the colonoscopy. -prostate ca screening : DRE wnl, check a PSA - (+) FH heart disease mother at age 56 and  (+) FH father prostate cancer at age 56.  -Encouraged to discuss weight management with the bariatric office. -Labs: Asked to come back fasting for a CMP, FLP, CBC, PSA, A1c, uric acid

## 2018-07-02 ENCOUNTER — Other Ambulatory Visit (INDEPENDENT_AMBULATORY_CARE_PROVIDER_SITE_OTHER): Payer: Commercial Managed Care - PPO

## 2018-07-02 DIAGNOSIS — M1000 Idiopathic gout, unspecified site: Secondary | ICD-10-CM | POA: Diagnosis not present

## 2018-07-02 DIAGNOSIS — Z0000 Encounter for general adult medical examination without abnormal findings: Secondary | ICD-10-CM

## 2018-07-02 LAB — CBC WITH DIFFERENTIAL/PLATELET
BASOS ABS: 0 10*3/uL (ref 0.0–0.1)
BASOS PCT: 0.5 % (ref 0.0–3.0)
EOS ABS: 0.4 10*3/uL (ref 0.0–0.7)
Eosinophils Relative: 6.3 % — ABNORMAL HIGH (ref 0.0–5.0)
HCT: 45.9 % (ref 39.0–52.0)
Hemoglobin: 15.2 g/dL (ref 13.0–17.0)
LYMPHS ABS: 1.8 10*3/uL (ref 0.7–4.0)
LYMPHS PCT: 31.4 % (ref 12.0–46.0)
MCHC: 33.1 g/dL (ref 30.0–36.0)
MCV: 91.3 fl (ref 78.0–100.0)
MONO ABS: 0.5 10*3/uL (ref 0.1–1.0)
Monocytes Relative: 7.7 % (ref 3.0–12.0)
NEUTROS ABS: 3.2 10*3/uL (ref 1.4–7.7)
NEUTROS PCT: 54.1 % (ref 43.0–77.0)
Platelets: 246 10*3/uL (ref 150.0–400.0)
RBC: 5.02 Mil/uL (ref 4.22–5.81)
RDW: 15.1 % (ref 11.5–15.5)
WBC: 5.9 10*3/uL (ref 4.0–10.5)

## 2018-07-02 LAB — COMPREHENSIVE METABOLIC PANEL
ALBUMIN: 3.9 g/dL (ref 3.5–5.2)
ALK PHOS: 70 U/L (ref 39–117)
ALT: 17 U/L (ref 0–53)
AST: 14 U/L (ref 0–37)
BUN: 13 mg/dL (ref 6–23)
CHLORIDE: 106 meq/L (ref 96–112)
CO2: 32 mEq/L (ref 19–32)
Calcium: 9.1 mg/dL (ref 8.4–10.5)
Creatinine, Ser: 1.18 mg/dL (ref 0.40–1.50)
GFR: 82.12 mL/min (ref >60.00)
Glucose, Bld: 112 mg/dL — ABNORMAL HIGH (ref 70–99)
POTASSIUM: 4.5 meq/L (ref 3.5–5.1)
Sodium: 142 mEq/L (ref 135–145)
TOTAL PROTEIN: 6.7 g/dL (ref 6.0–8.3)
Total Bilirubin: 0.3 mg/dL (ref 0.2–1.2)

## 2018-07-02 LAB — LIPID PANEL
CHOLESTEROL: 200 mg/dL (ref 0–200)
HDL: 35.9 mg/dL — AB (ref >39.00)
LDL CALC: 152 mg/dL — AB (ref 0–99)
NONHDL: 163.76
Total CHOL/HDL Ratio: 6
Triglycerides: 61 mg/dL (ref 0.0–149.0)
VLDL: 12.2 mg/dL (ref 0.0–40.0)

## 2018-07-02 LAB — HEMOGLOBIN A1C: Hgb A1c MFr Bld: 6.1 % (ref 4.6–6.5)

## 2018-07-02 LAB — PSA: PSA: 0.32 ng/mL (ref 0.10–4.00)

## 2018-07-02 LAB — URIC ACID: URIC ACID, SERUM: 6.3 mg/dL (ref 4.0–7.8)

## 2018-07-02 NOTE — Assessment & Plan Note (Signed)
Gout: On allopurinol, no symptoms.  Checking labs Pain, right leg as described above: Recommend to discuss with Ortho Occasional numbness, left fifth finger.  Recommend observation.  If symptoms persistent, recommend to discuss with Ortho.  Cubital tunnel syndrome? RTC 1 year

## 2018-07-15 ENCOUNTER — Ambulatory Visit (INDEPENDENT_AMBULATORY_CARE_PROVIDER_SITE_OTHER): Payer: Commercial Managed Care - PPO | Admitting: Orthopaedic Surgery

## 2018-07-15 ENCOUNTER — Ambulatory Visit (INDEPENDENT_AMBULATORY_CARE_PROVIDER_SITE_OTHER): Payer: Self-pay

## 2018-07-15 ENCOUNTER — Encounter (INDEPENDENT_AMBULATORY_CARE_PROVIDER_SITE_OTHER): Payer: Self-pay | Admitting: Orthopaedic Surgery

## 2018-07-15 DIAGNOSIS — M1711 Unilateral primary osteoarthritis, right knee: Secondary | ICD-10-CM

## 2018-07-15 DIAGNOSIS — G8929 Other chronic pain: Secondary | ICD-10-CM

## 2018-07-15 DIAGNOSIS — M25561 Pain in right knee: Secondary | ICD-10-CM | POA: Diagnosis not present

## 2018-07-15 NOTE — Progress Notes (Signed)
Office Visit Note   Patient: Ian Wilson           Date of Birth: November 01, 1960           MRN: [id number] Visit Date: 07/15/2018              Requested by: Wanda Plump, MD [address] [place], [place] [address] PCP: Wanda Plump, MD   Assessment & Plan: Visit Diagnoses:  1. Chronic pain of right knee   2. Unilateral primary osteoarthritis, right knee     Plan: Ian Wilson unfortunately has end-stage degenerative joint disease of his right knee.  He wishes to proceed with right total knee replacement.  He did very well from his left knee replacement.  Risks and benefits and rehab and recovery were reviewed with the patient again.  Denies history of DVT or aspirin allergy.  Follow-Up Instructions: Return for 2 week postop visit.   Orders:  Orders Placed This Encounter  Procedures  . XR KNEE 3 VIEW RIGHT   No orders of the defined types were placed in this encounter.     Procedures: No procedures performed   Clinical Data: No additional findings.   Subjective: Chief Complaint  Patient presents with  . Right Knee - Pain    Ian Wilson is a 57 year old gentleman is 8 months status post left total knee replacement.  He is doing very well and is happy with his progress and recovery.  He comes in today to discuss scheduling of his right total knee replacement.  He was trying to wait until next year but he now has chronic debilitating pain with ADLs that severely limits him.  He had no problems with the left knee replacement.   Review of Systems  Constitutional: Negative.   All other systems reviewed and are negative.    Objective: Vital Signs: There were no vitals taken for this visit.  Physical Exam  Constitutional: He is oriented to person, place, and time. He appears well-developed and well-nourished.  Pulmonary/Chest: Effort normal.  Abdominal: Soft.  Neurological: He is alert and oriented to person, place, and time.  Skin: Skin is warm.    Psychiatric: He has a normal mood and affect. His behavior is normal. Judgment and thought content normal.  Nursing note and vitals reviewed.   Ortho Exam Left knee exam shows a fully healed surgical scar.  He has excellent range of motion from 0 to 125 degrees.  No joint effusion.  Right knee exam shows a varus deformity.  Range of motion is 10 to 100 degrees.  Collaterals and cruciates are stable. Specialty Comments:  No specialty comments available.  Imaging: Xr Knee 3 View Right  Result Date: 07/15/2018 Advanced tricompartmental DJD    PMFS History: Patient Active Problem List   Diagnosis Date Noted  . Morbid obesity (HCC) 05/13/2018  . Body mass index 40.0-44.9, adult (HCC) 05/13/2018  . Presence of left artificial knee joint 02/10/2018  . S/P TKR (total knee replacement), left 11/21/2017  . PCP NOTES >>>>>>>>>>>>>>>>> 06/19/2016  . Diverticulitis of intestine without perforation or abscess without bleeding 08/02/2015  . Ankle pain 12/16/2014  . Diarrhea, Intermittent, lactose intolerance ? 04/13/2014  . Annual physical exam 12/25/2013  . DJD (degenerative joint disease) 11/18/2013  . Gout 10/29/2012  . HTN (hypertension)    Past Medical History:  Diagnosis Date  . Arthritis    primary localized osteoarthritis B/L knees  . Diverticulosis   . Gout   .  Headache   . HTN (hypertension)   . Obesity   . Wears glasses     Family History  Problem Relation Age of Onset  . CAD Mother        CABG in her 49s  . Stroke Mother   . Diabetes Mother   . Prostate cancer Father 35  . Colon cancer Neg Hx     Past Surgical History:  Procedure Laterality Date  . COLONOSCOPY    . TOTAL KNEE ARTHROPLASTY Left 11/21/2017   Procedure: LEFT TOTAL KNEE ARTHROPLASTY;  Surgeon: Tarry Kos, MD;  Location: [facility] OR;  Service: Orthopedics;  Laterality: Left;   Social History   Occupational History  . Occupation: Airline pilot    Tobacco Use  . Smoking status: Never Smoker  . Smokeless  tobacco: Never Used  Substance and Sexual Activity  . Alcohol use: No  . Drug use: No  . Sexual activity: Not on file

## 2018-08-25 NOTE — Pre-Procedure Instructions (Signed)
Ian Wilson  08/25/2018      [facility]- [place], [place] - [place], [place] - [address] [place] [address] Phone: [phone] Fax: [phone]    Your procedure is scheduled on September 01, 2018.  Report to [facility] Admitting at 800 AM.  Call this number if you have problems the morning of surgery:  [phone]   Remember:  Do not eat or drink after midnight.    Take these medicines the morning of surgery with A SIP OF WATER  Allopurinol (zyloprim)  7 days prior to surgery STOP taking any Aspirin (unless otherwise instructed by your surgeon), Aleve, Naproxen, Ibuprofen, Motrin, Advil, Goody's, BC's, all herbal medications, fish oil, and all vitamins   Do not wear jewelry  Do not wear lotions, powders, or colognes, or deodorant.   Men may shave face and neck.  Do not bring valuables to the hospital.  [facility] is not responsible for any belongings or valuables.  Contacts, dentures or bridgework may not be worn into surgery.  Leave your suitcase in the car.  After surgery it may be brought to your room.  For patients admitted to the hospital, discharge time will be determined by your treatment team.  Patients discharged the day of surgery will not be allowed to drive home.    - Preparing For Surgery  Before surgery, you can play an important role. Because skin is not sterile, your skin needs to be as free of germs as possible. You can reduce the number of germs on your skin by washing with CHG (chlorahexidine gluconate) Soap before surgery.  CHG is an antiseptic cleaner which kills germs and bonds with the skin to continue killing germs even after washing.    Oral Hygiene is also important to reduce your risk of infection.  Remember - BRUSH YOUR TEETH THE MORNING OF SURGERY WITH YOUR REGULAR TOOTHPASTE  Please do not use if you have an allergy to CHG or antibacterial soaps. If your skin  becomes reddened/irritated stop using the CHG.  Do not shave (including legs and underarms) for at least 48 hours prior to first CHG shower. It is OK to shave your face.  Please follow these instructions carefully.   1. Shower the NIGHT BEFORE SURGERY and the MORNING OF SURGERY with CHG.   2. If you chose to wash your hair, wash your hair first as usual with your normal shampoo.  3. After you shampoo, rinse your hair and body thoroughly to remove the shampoo.  4. Use CHG as you would any other liquid soap. You can apply CHG directly to the skin and wash gently with a scrungie or a clean washcloth.   5. Apply the CHG Soap to your body ONLY FROM THE NECK DOWN.  Do not use on open wounds or open sores. Avoid contact with your eyes, ears, mouth and genitals (private parts). Wash Face and genitals (private parts)  with your normal soap.  6. Wash thoroughly, paying special attention to the area where your surgery will be performed.  7. Thoroughly rinse your body with warm water from the neck down.  8. DO NOT shower/wash with your normal soap after using and rinsing off the CHG Soap.  9. Pat yourself dry with a CLEAN TOWEL.  10. Wear CLEAN PAJAMAS to bed the night before surgery, wear comfortable clothes the morning of surgery  11. Place CLEAN SHEETS on your bed the night  of your first shower and DO NOT SLEEP WITH PETS.  Day of Surgery:  Do not apply any deodorants/lotions.  Please wear clean clothes to the hospital/surgery center.   Remember to brush your teeth WITH YOUR REGULAR TOOTHPASTE.  Please read over the following fact sheets that you were given.

## 2018-08-25 NOTE — Progress Notes (Addendum)
PCP: Willow Ora, MD  Cardiologist:  Pt denies  EKG: 08/26/18 to be obtained at PAT appt  Stress test:  Pt denies  ECHO: pt denies  Cardiac Cath: pt denies  Chest x-ray: 11/14/17 in [facility]

## 2018-08-26 ENCOUNTER — Other Ambulatory Visit: Payer: Self-pay

## 2018-08-26 ENCOUNTER — Encounter (HOSPITAL_COMMUNITY): Payer: Self-pay

## 2018-08-26 ENCOUNTER — Encounter (HOSPITAL_COMMUNITY)
Admission: RE | Admit: 2018-08-26 | Discharge: 2018-08-26 | Disposition: A | Discharge status: Home / Self Care | Payer: Commercial Managed Care - PPO | Source: Ambulatory Visit | Attending: Orthopaedic Surgery | Admitting: Orthopaedic Surgery

## 2018-08-26 DIAGNOSIS — I498 Other specified cardiac arrhythmias: Secondary | ICD-10-CM | POA: Diagnosis not present

## 2018-08-26 DIAGNOSIS — Z01818 Encounter for other preprocedural examination: Secondary | ICD-10-CM | POA: Diagnosis not present

## 2018-08-26 DIAGNOSIS — M1711 Unilateral primary osteoarthritis, right knee: Secondary | ICD-10-CM | POA: Diagnosis not present

## 2018-08-26 LAB — SURGICAL PCR SCREEN
MRSA, PCR: NEGATIVE
STAPHYLOCOCCUS AUREUS: NEGATIVE

## 2018-08-26 LAB — CBC WITH DIFFERENTIAL/PLATELET
ABS IMMATURE GRANULOCYTES: 0.02 10*3/uL (ref 0.00–0.07)
Basophils Absolute: 0 10*3/uL (ref 0.0–0.1)
Basophils Relative: 1 %
EOS PCT: 5 %
Eosinophils Absolute: 0.3 10*3/uL (ref 0.0–0.5)
HCT: 53.2 % — ABNORMAL HIGH (ref 39.0–52.0)
HEMOGLOBIN: 15.6 g/dL (ref 13.0–17.0)
Immature Granulocytes: 0 %
LYMPHS ABS: 1.5 10*3/uL (ref 0.7–4.0)
LYMPHS PCT: 26 %
MCH: 28.6 pg (ref 26.0–34.0)
MCHC: 29.3 g/dL — AB (ref 30.0–36.0)
MCV: 97.4 fL (ref 80.0–100.0)
MONO ABS: 0.5 10*3/uL (ref 0.1–1.0)
MONOS PCT: 9 %
NEUTROS ABS: 3.5 10*3/uL (ref 1.7–7.7)
Neutrophils Relative %: 59 %
Platelets: 233 10*3/uL (ref 150–400)
RBC: 5.46 MIL/uL (ref 4.22–5.81)
RDW: 13.2 % (ref 11.5–15.5)
WBC: 5.9 10*3/uL (ref 4.0–10.5)
nRBC: 0 % (ref 0.0–0.2)

## 2018-08-26 LAB — PROTIME-INR
INR: 0.96
Prothrombin Time: 12.7 seconds (ref 11.4–15.2)

## 2018-08-26 LAB — TYPE AND SCREEN
ABO/RH(D): B POS
Antibody Screen: NEGATIVE

## 2018-08-26 LAB — COMPREHENSIVE METABOLIC PANEL
ALT: 16 U/L (ref 0–44)
ANION GAP: 7 (ref 5–15)
AST: 18 U/L (ref 15–41)
Albumin: 3.6 g/dL (ref 3.5–5.0)
Alkaline Phosphatase: 69 U/L (ref 38–126)
BILIRUBIN TOTAL: 0.7 mg/dL (ref 0.3–1.2)
BUN: 11 mg/dL (ref 6–20)
CO2: 25 mmol/L (ref 22–32)
CREATININE: 1.22 mg/dL (ref 0.61–1.24)
Calcium: 8.5 mg/dL — ABNORMAL LOW (ref 8.9–10.3)
Chloride: 107 mmol/L (ref 98–111)
GFR calc non Af Amer: >60 mL/min (ref >60)
Glucose, Bld: 100 mg/dL — ABNORMAL HIGH (ref 70–99)
Potassium: 3.6 mmol/L (ref 3.5–5.1)
SODIUM: 139 mmol/L (ref 135–145)
TOTAL PROTEIN: 6.9 g/dL (ref 6.5–8.1)

## 2018-08-26 LAB — APTT: aPTT: 33 seconds (ref 24–36)

## 2018-08-26 MED ORDER — CHLORHEXIDINE GLUCONATE 4 % EX LIQD: 60.0000 mL | Freq: Once | CUTANEOUS | Status: DC

## 2018-08-30 MED ORDER — DEXTROSE 5 % IV SOLN
3.0000 g | INTRAVENOUS | Status: AC
  Administered 2018-09-01: 3 g via INTRAVENOUS
  Filled 2018-08-30: qty 3

## 2018-08-30 MED ORDER — TRANEXAMIC ACID-NACL 1000-0.7 MG/100ML-% IV SOLN
1000.0000 mg | INTRAVENOUS | Status: AC
  Administered 2018-09-01: 1000 mg via INTRAVENOUS
  Filled 2018-08-30: qty 100

## 2018-08-30 MED ORDER — TRANEXAMIC ACID 1000 MG/10ML IV SOLN
2000.0000 mg | INTRAVENOUS | Status: AC
  Administered 2018-09-01: 2000 mg via TOPICAL
  Filled 2018-08-30: qty 20

## 2018-08-31 ENCOUNTER — Encounter (HOSPITAL_COMMUNITY): Payer: Self-pay | Admitting: Anesthesiology

## 2018-08-31 NOTE — Anesthesia Preprocedure Evaluation (Addendum)
Anesthesia Evaluation  Patient identified by MRN, date of birth, ID band Patient awake    Reviewed: Allergy & Precautions, NPO status , Patient's Chart, lab work & pertinent test results  Airway Mallampati: I  TM Distance: >3 FB Neck ROM: Full    Dental  (+) Teeth Intact, Dental Advisory Given   Pulmonary    breath sounds clear to auscultation       Cardiovascular hypertension, Pt. on medications  Rhythm:Regular Rate:Normal     Neuro/Psych  Headaches,    GI/Hepatic negative GI ROS, Neg liver ROS,   Endo/Other  negative endocrine ROS  Renal/GU negative Renal ROS     Musculoskeletal  (+) Arthritis ,   Abdominal Normal abdominal exam  (+)   Peds  Hematology negative hematology ROS (+)   Anesthesia Other Findings   Reproductive/Obstetrics                           Lab Results  Component Value Date   WBC 5.9 08/26/2018   HGB 15.6 08/26/2018   HCT 53.2 (H) 08/26/2018   MCV 97.4 08/26/2018   PLT 233 08/26/2018   Lab Results  Component Value Date   CREATININE 1.22 08/26/2018   BUN 11 08/26/2018   NA 139 08/26/2018   K 3.6 08/26/2018   CL 107 08/26/2018   CO2 25 08/26/2018   Lab Results  Component Value Date   INR 0.96 08/26/2018   INR 0.95 11/13/2017   EKG: NSR  Anesthesia Physical Anesthesia Plan  ASA: III  Anesthesia Plan: Spinal   Post-op Pain Management:  Regional for Post-op pain   Induction: Intravenous  PONV Risk Score and Plan: 2 and Ondansetron, Dexamethasone and Midazolam  Airway Management Planned: Simple Face Mask  Additional Equipment: None  Intra-op Plan:   Post-operative Plan: Extubation in OR  Informed Consent: I have reviewed the patients History and Physical, chart, labs and discussed the procedure including the risks, benefits and alternatives for the proposed anesthesia with the patient or authorized representative who has indicated his/her  understanding and acceptance.   Dental advisory given  Plan Discussed with: CRNA  Anesthesia Plan Comments:        Anesthesia Quick Evaluation

## 2018-09-01 ENCOUNTER — Inpatient Hospital Stay (HOSPITAL_COMMUNITY): Payer: Commercial Managed Care - PPO | Admitting: Anesthesiology

## 2018-09-01 ENCOUNTER — Observation Stay (HOSPITAL_COMMUNITY): Payer: Commercial Managed Care - PPO

## 2018-09-01 ENCOUNTER — Encounter (HOSPITAL_COMMUNITY): Payer: Self-pay | Admitting: Anesthesiology

## 2018-09-01 ENCOUNTER — Inpatient Hospital Stay (HOSPITAL_COMMUNITY)
Admission: RE | Admit: 2018-09-01 | Discharge: 2018-09-04 | DRG: 470 | Disposition: A | Discharge status: Home Health | Payer: Commercial Managed Care - PPO | Source: Ambulatory Visit | Attending: Orthopaedic Surgery | Admitting: Orthopaedic Surgery

## 2018-09-01 ENCOUNTER — Encounter (HOSPITAL_COMMUNITY)
Admission: RE | Disposition: A | Discharge status: Home Health | Payer: Self-pay | Source: Ambulatory Visit | Attending: Orthopaedic Surgery

## 2018-09-01 DIAGNOSIS — I10 Essential (primary) hypertension: Secondary | ICD-10-CM | POA: Diagnosis present

## 2018-09-01 DIAGNOSIS — M1711 Unilateral primary osteoarthritis, right knee: Secondary | ICD-10-CM | POA: Diagnosis not present

## 2018-09-01 DIAGNOSIS — Z79899 Other long term (current) drug therapy: Secondary | ICD-10-CM | POA: Diagnosis not present

## 2018-09-01 DIAGNOSIS — M109 Gout, unspecified: Secondary | ICD-10-CM | POA: Diagnosis not present

## 2018-09-01 DIAGNOSIS — Z96651 Presence of right artificial knee joint: Secondary | ICD-10-CM

## 2018-09-01 DIAGNOSIS — E669 Obesity, unspecified: Secondary | ICD-10-CM | POA: Diagnosis present

## 2018-09-01 DIAGNOSIS — Z96652 Presence of left artificial knee joint: Secondary | ICD-10-CM | POA: Diagnosis present

## 2018-09-01 DIAGNOSIS — Z6841 Body Mass Index (BMI) 40.0 and over, adult: Secondary | ICD-10-CM

## 2018-09-01 DIAGNOSIS — Z96659 Presence of unspecified artificial knee joint: Secondary | ICD-10-CM

## 2018-09-01 DIAGNOSIS — Z471 Aftercare following joint replacement surgery: Secondary | ICD-10-CM | POA: Diagnosis not present

## 2018-09-01 DIAGNOSIS — G8918 Other acute postprocedural pain: Secondary | ICD-10-CM | POA: Diagnosis not present

## 2018-09-01 HISTORY — PX: TOTAL KNEE ARTHROPLASTY: SHX125

## 2018-09-01 SURGERY — ARTHROPLASTY, KNEE, TOTAL
Anesthesia: General | Site: Knee | Laterality: Right

## 2018-09-01 MED ORDER — METHOCARBAMOL 750 MG PO TABS: 750.0000 mg | ORAL_TABLET | Freq: Two times a day (BID) | ORAL | 0 refills | Status: DC | PRN

## 2018-09-01 MED ORDER — LIDOCAINE 2% (20 MG/ML) 5 ML SYRINGE
INTRAMUSCULAR | Status: AC
  Filled 2018-09-01: qty 5

## 2018-09-01 MED ORDER — DEXAMETHASONE SODIUM PHOSPHATE 10 MG/ML IJ SOLN
INTRAMUSCULAR | Status: DC | PRN
  Administered 2018-09-01: 10 mg via INTRAVENOUS

## 2018-09-01 MED ORDER — LACTATED RINGERS IV SOLN
INTRAVENOUS | Status: DC
  Administered 2018-09-01 (×2): via INTRAVENOUS

## 2018-09-01 MED ORDER — HYDROMORPHONE HCL 1 MG/ML IJ SOLN: 0.2500 mg | INTRAMUSCULAR | Status: DC | PRN

## 2018-09-01 MED ORDER — METHOCARBAMOL 500 MG PO TABS
500.0000 mg | ORAL_TABLET | Freq: Four times a day (QID) | ORAL | Status: DC | PRN
  Administered 2018-09-03: 500 mg via ORAL
  Filled 2018-09-01: qty 1

## 2018-09-01 MED ORDER — FENTANYL CITRATE (PF) 100 MCG/2ML IJ SOLN
INTRAMUSCULAR | Status: AC
  Administered 2018-09-01: 50 ug via INTRAVENOUS
  Filled 2018-09-01: qty 2

## 2018-09-01 MED ORDER — PROMETHAZINE HCL 25 MG/ML IJ SOLN: 6.2500 mg | INTRAMUSCULAR | Status: DC | PRN

## 2018-09-01 MED ORDER — DOCUSATE SODIUM 100 MG PO CAPS
100.0000 mg | ORAL_CAPSULE | Freq: Two times a day (BID) | ORAL | Status: DC
  Administered 2018-09-01 – 2018-09-04 (×3): 100 mg via ORAL
  Filled 2018-09-01 (×7): qty 1

## 2018-09-01 MED ORDER — HYDROMORPHONE HCL 1 MG/ML IJ SOLN
INTRAMUSCULAR | Status: AC
  Filled 2018-09-01: qty 1

## 2018-09-01 MED ORDER — OXYCODONE HCL 5 MG PO TABS
5.0000 mg | ORAL_TABLET | ORAL | Status: DC | PRN
  Administered 2018-09-04: 10 mg via ORAL
  Filled 2018-09-01 (×2): qty 2

## 2018-09-01 MED ORDER — ACETAMINOPHEN 500 MG PO TABS
1000.0000 mg | ORAL_TABLET | Freq: Four times a day (QID) | ORAL | Status: AC
  Administered 2018-09-01 – 2018-09-02 (×4): 1000 mg via ORAL
  Filled 2018-09-01 (×4): qty 2

## 2018-09-01 MED ORDER — ONDANSETRON HCL 4 MG PO TABS: 4.0000 mg | ORAL_TABLET | Freq: Four times a day (QID) | ORAL | Status: DC | PRN

## 2018-09-01 MED ORDER — SODIUM CHLORIDE 0.9 % IR SOLN
Status: DC | PRN
  Administered 2018-09-01: 3000 mL

## 2018-09-01 MED ORDER — ONDANSETRON HCL 4 MG/2ML IJ SOLN
INTRAMUSCULAR | Status: AC
  Filled 2018-09-01: qty 2

## 2018-09-01 MED ORDER — METOCLOPRAMIDE HCL 5 MG/ML IJ SOLN: 5.0000 mg | Freq: Three times a day (TID) | INTRAMUSCULAR | Status: DC | PRN

## 2018-09-01 MED ORDER — OXYCODONE HCL ER 10 MG PO T12A
10.0000 mg | EXTENDED_RELEASE_TABLET | Freq: Two times a day (BID) | ORAL | Status: DC
  Administered 2018-09-01 – 2018-09-04 (×6): 10 mg via ORAL
  Filled 2018-09-01 (×6): qty 1

## 2018-09-01 MED ORDER — SODIUM CHLORIDE 0.9% FLUSH
INTRAVENOUS | Status: DC | PRN
  Administered 2018-09-01: 40 mL

## 2018-09-01 MED ORDER — POLYETHYLENE GLYCOL 3350 17 G PO PACK: 17.0000 g | PACK | Freq: Every day | ORAL | Status: DC | PRN

## 2018-09-01 MED ORDER — OXYCODONE HCL 5 MG PO TABS
10.0000 mg | ORAL_TABLET | ORAL | Status: DC | PRN
  Administered 2018-09-04: 10 mg via ORAL

## 2018-09-01 MED ORDER — 0.9 % SODIUM CHLORIDE (POUR BTL) OPTIME
TOPICAL | Status: DC | PRN
  Administered 2018-09-01: 1000 mL

## 2018-09-01 MED ORDER — PROMETHAZINE HCL 25 MG PO TABS: 25.0000 mg | ORAL_TABLET | Freq: Four times a day (QID) | ORAL | 1 refills | Status: DC | PRN

## 2018-09-01 MED ORDER — ONDANSETRON HCL 4 MG/2ML IJ SOLN
INTRAMUSCULAR | Status: DC | PRN
  Administered 2018-09-01: 4 mg via INTRAVENOUS

## 2018-09-01 MED ORDER — FENTANYL CITRATE (PF) 100 MCG/2ML IJ SOLN
50.0000 ug | Freq: Once | INTRAMUSCULAR | Status: AC
  Administered 2018-09-01: 50 ug via INTRAVENOUS

## 2018-09-01 MED ORDER — FENTANYL CITRATE (PF) 250 MCG/5ML IJ SOLN
INTRAMUSCULAR | Status: AC
  Filled 2018-09-01: qty 5

## 2018-09-01 MED ORDER — OXYCODONE HCL 5 MG PO TABS: 5.0000 mg | ORAL_TABLET | ORAL | 0 refills | Status: DC | PRN

## 2018-09-01 MED ORDER — ASPIRIN EC 81 MG PO TBEC: 81.0000 mg | DELAYED_RELEASE_TABLET | Freq: Two times a day (BID) | ORAL | 0 refills | Status: AC

## 2018-09-01 MED ORDER — ONDANSETRON HCL 4 MG PO TABS: 4.0000 mg | ORAL_TABLET | Freq: Three times a day (TID) | ORAL | 0 refills | Status: AC | PRN

## 2018-09-01 MED ORDER — METHOCARBAMOL 1000 MG/10ML IJ SOLN
500.0000 mg | Freq: Four times a day (QID) | INTRAVENOUS | Status: DC | PRN
  Filled 2018-09-01: qty 5

## 2018-09-01 MED ORDER — ONDANSETRON HCL 4 MG/2ML IJ SOLN
4.0000 mg | Freq: Four times a day (QID) | INTRAMUSCULAR | Status: DC | PRN
  Administered 2018-09-01: 4 mg via INTRAVENOUS
  Filled 2018-09-01: qty 2

## 2018-09-01 MED ORDER — MAGNESIUM CITRATE PO SOLN: 1.0000 | Freq: Once | ORAL | Status: DC | PRN

## 2018-09-01 MED ORDER — PHENOL 1.4 % MT LIQD: 1.0000 | OROMUCOSAL | Status: DC | PRN

## 2018-09-01 MED ORDER — SENNOSIDES-DOCUSATE SODIUM 8.6-50 MG PO TABS: 1.0000 | ORAL_TABLET | Freq: Every evening | ORAL | 1 refills | Status: AC | PRN

## 2018-09-01 MED ORDER — MIDAZOLAM HCL 2 MG/2ML IJ SOLN
2.0000 mg | Freq: Once | INTRAMUSCULAR | Status: AC
  Administered 2018-09-01: 2 mg via INTRAVENOUS

## 2018-09-01 MED ORDER — HYDROMORPHONE HCL 1 MG/ML IJ SOLN: 0.5000 mg | INTRAMUSCULAR | Status: DC | PRN

## 2018-09-01 MED ORDER — VANCOMYCIN HCL 1000 MG IV SOLR
INTRAVENOUS | Status: AC
  Filled 2018-09-01: qty 1000

## 2018-09-01 MED ORDER — SULFAMETHOXAZOLE-TRIMETHOPRIM 800-160 MG PO TABS: 1.0000 | ORAL_TABLET | Freq: Two times a day (BID) | ORAL | 0 refills | Status: DC

## 2018-09-01 MED ORDER — KETOROLAC TROMETHAMINE 15 MG/ML IJ SOLN
30.0000 mg | Freq: Four times a day (QID) | INTRAMUSCULAR | Status: AC
  Administered 2018-09-01 – 2018-09-02 (×4): 30 mg via INTRAVENOUS
  Filled 2018-09-01 (×3): qty 2

## 2018-09-01 MED ORDER — ALUM & MAG HYDROXIDE-SIMETH 200-200-20 MG/5ML PO SUSP: 30.0000 mL | ORAL | Status: DC | PRN

## 2018-09-01 MED ORDER — MENTHOL 3 MG MT LOZG: 1.0000 | LOZENGE | OROMUCOSAL | Status: DC | PRN

## 2018-09-01 MED ORDER — MIDAZOLAM HCL 2 MG/2ML IJ SOLN
INTRAMUSCULAR | Status: AC
  Administered 2018-09-01: 2 mg via INTRAVENOUS
  Filled 2018-09-01: qty 2

## 2018-09-01 MED ORDER — DEXAMETHASONE SODIUM PHOSPHATE 10 MG/ML IJ SOLN
INTRAMUSCULAR | Status: AC
  Filled 2018-09-01: qty 1

## 2018-09-01 MED ORDER — METOCLOPRAMIDE HCL 5 MG PO TABS: 5.0000 mg | ORAL_TABLET | Freq: Three times a day (TID) | ORAL | Status: DC | PRN

## 2018-09-01 MED ORDER — GABAPENTIN 300 MG PO CAPS
300.0000 mg | ORAL_CAPSULE | Freq: Three times a day (TID) | ORAL | Status: DC
  Administered 2018-09-01 – 2018-09-04 (×9): 300 mg via ORAL
  Filled 2018-09-01 (×10): qty 1

## 2018-09-01 MED ORDER — HYDROMORPHONE HCL 1 MG/ML IJ SOLN
0.2500 mg | INTRAMUSCULAR | Status: DC | PRN
  Administered 2018-09-01 (×2): 0.5 mg via INTRAVENOUS

## 2018-09-01 MED ORDER — SULFAMETHOXAZOLE-TRIMETHOPRIM 800-160 MG PO TABS
1.0000 | ORAL_TABLET | Freq: Two times a day (BID) | ORAL | Status: DC
  Administered 2018-09-01 – 2018-09-04 (×6): 1 via ORAL
  Filled 2018-09-01 (×6): qty 1

## 2018-09-01 MED ORDER — FENTANYL CITRATE (PF) 100 MCG/2ML IJ SOLN
INTRAMUSCULAR | Status: DC | PRN
  Administered 2018-09-01 (×6): 50 ug via INTRAVENOUS

## 2018-09-01 MED ORDER — BUPIVACAINE LIPOSOME 1.3 % IJ SUSP
20.0000 mL | Freq: Once | INTRAMUSCULAR | Status: AC
  Administered 2018-09-01: 20 mL
  Filled 2018-09-01: qty 20

## 2018-09-01 MED ORDER — PROPOFOL 10 MG/ML IV BOLUS
INTRAVENOUS | Status: DC | PRN
  Administered 2018-09-01: 30 mg via INTRAVENOUS
  Administered 2018-09-01: 200 mg via INTRAVENOUS
  Administered 2018-09-01: 10 mg via INTRAVENOUS
  Administered 2018-09-01: 20 mg via INTRAVENOUS

## 2018-09-01 MED ORDER — SODIUM CHLORIDE 0.9 % IV SOLN
INTRAVENOUS | Status: DC
  Administered 2018-09-01: 17:00:00 via INTRAVENOUS

## 2018-09-01 MED ORDER — OXYCODONE HCL ER 10 MG PO T12A: 10.0000 mg | EXTENDED_RELEASE_TABLET | Freq: Two times a day (BID) | ORAL | 0 refills | Status: AC

## 2018-09-01 MED ORDER — CEFAZOLIN SODIUM-DEXTROSE 2-4 GM/100ML-% IV SOLN
2.0000 g | Freq: Four times a day (QID) | INTRAVENOUS | Status: AC
  Administered 2018-09-01 – 2018-09-02 (×2): 2 g via INTRAVENOUS
  Filled 2018-09-01 (×3): qty 100

## 2018-09-01 MED ORDER — ASPIRIN 81 MG PO CHEW
81.0000 mg | CHEWABLE_TABLET | Freq: Two times a day (BID) | ORAL | Status: DC
  Administered 2018-09-01 – 2018-09-04 (×6): 81 mg via ORAL
  Filled 2018-09-01 (×6): qty 1

## 2018-09-01 MED ORDER — KETOROLAC TROMETHAMINE 15 MG/ML IJ SOLN
INTRAMUSCULAR | Status: AC
  Filled 2018-09-01: qty 2

## 2018-09-01 MED ORDER — LIDOCAINE 2% (20 MG/ML) 5 ML SYRINGE
INTRAMUSCULAR | Status: DC | PRN
  Administered 2018-09-01: 50 mg via INTRAVENOUS

## 2018-09-01 MED ORDER — CELECOXIB 200 MG PO CAPS
200.0000 mg | ORAL_CAPSULE | Freq: Two times a day (BID) | ORAL | Status: DC
  Administered 2018-09-01 – 2018-09-04 (×6): 200 mg via ORAL
  Filled 2018-09-01 (×7): qty 1

## 2018-09-01 MED ORDER — SODIUM CHLORIDE 0.9 % IV SOLN
INTRAVENOUS | Status: DC | PRN
  Administered 2018-09-01: 10 ug/min via INTRAVENOUS
  Administered 2018-09-01: 25 ug/min via INTRAVENOUS

## 2018-09-01 MED ORDER — DEXAMETHASONE SODIUM PHOSPHATE 10 MG/ML IJ SOLN
10.0000 mg | Freq: Once | INTRAMUSCULAR | Status: AC
  Administered 2018-09-02: 10 mg via INTRAVENOUS
  Filled 2018-09-01: qty 1

## 2018-09-01 MED ORDER — ACETAMINOPHEN 325 MG PO TABS
325.0000 mg | ORAL_TABLET | Freq: Four times a day (QID) | ORAL | Status: DC | PRN
  Administered 2018-09-04: 650 mg via ORAL
  Filled 2018-09-01: qty 2

## 2018-09-01 MED ORDER — SORBITOL 70 % SOLN: 30.0000 mL | Freq: Every day | Status: DC | PRN

## 2018-09-01 MED ORDER — LACTATED RINGERS IV SOLN
INTRAVENOUS | Status: DC
  Administered 2018-09-01: 14:00:00 via INTRAVENOUS

## 2018-09-01 MED ORDER — MIDAZOLAM HCL 2 MG/2ML IJ SOLN
INTRAMUSCULAR | Status: AC
  Filled 2018-09-01: qty 2

## 2018-09-01 MED ORDER — VANCOMYCIN HCL 1000 MG IV SOLR
INTRAVENOUS | Status: DC | PRN
  Administered 2018-09-01: 1000 mg via TOPICAL

## 2018-09-01 MED ORDER — TRANEXAMIC ACID-NACL 1000-0.7 MG/100ML-% IV SOLN
1000.0000 mg | Freq: Once | INTRAVENOUS | Status: AC
  Administered 2018-09-01: 1000 mg via INTRAVENOUS
  Filled 2018-09-01: qty 100

## 2018-09-01 MED ORDER — DIPHENHYDRAMINE HCL 12.5 MG/5ML PO ELIX: 25.0000 mg | ORAL_SOLUTION | ORAL | Status: DC | PRN

## 2018-09-01 MED ORDER — PROPOFOL 10 MG/ML IV BOLUS
INTRAVENOUS | Status: AC
  Filled 2018-09-01: qty 20

## 2018-09-01 SURGICAL SUPPLY — 75 items
ALCOHOL ISOPROPYL (RUBBING) (MISCELLANEOUS) ×3 IMPLANT
BAG DECANTER FOR FLEXI CONT (MISCELLANEOUS) ×3 IMPLANT
BANDAGE ESMARK 6X9 LF (GAUZE/BANDAGES/DRESSINGS) ×1 IMPLANT
BASEPLATE TIB CEM GEN SZ8 RT (Plate) ×3 IMPLANT
BLADE SAW SGTL 13.0X1.19X90.0M (BLADE) ×3 IMPLANT
BNDG CMPR MED 10X6 ELC LF (GAUZE/BANDAGES/DRESSINGS) ×1
BNDG ELASTIC 6X10 VLCR STRL LF (GAUZE/BANDAGES/DRESSINGS) ×3 IMPLANT
BNDG ESMARK 6X9 LF (GAUZE/BANDAGES/DRESSINGS) ×3
BOWL SMART MIX CTS (DISPOSABLE) ×3 IMPLANT
BSPLAT TIB 8 CMNT KN RT NPOR (Plate) ×1 IMPLANT
CEMENT BONE REFOBACIN R1X40 US (Cement) ×6 IMPLANT
CLOSURE STERI-STRIP 1/2X4 (GAUZE/BANDAGES/DRESSINGS) ×1
CLSR STERI-STRIP ANTIMIC 1/2X4 (GAUZE/BANDAGES/DRESSINGS) ×2 IMPLANT
COMP PATELLA GENESIS 38X9 (Knees) ×3 IMPLANT
COMPONENT PATELLA GENESIS 38X9 (Knees) ×1 IMPLANT
COVER SURGICAL LIGHT HANDLE (MISCELLANEOUS) ×3 IMPLANT
COVER WAND RF STERILE (DRAPES) ×3 IMPLANT
CUFF TOURNIQUET SINGLE 34IN LL (TOURNIQUET CUFF) ×3 IMPLANT
CUFF TOURNIQUET SINGLE 44IN (TOURNIQUET CUFF) IMPLANT
DRAPE EXTREMITY T 121X128X90 (DRAPE) ×3 IMPLANT
DRAPE HALF SHEET 40X57 (DRAPES) ×3 IMPLANT
DRAPE INCISE IOBAN 66X45 STRL (DRAPES) ×3 IMPLANT
DRAPE ORTHO SPLIT 77X108 STRL (DRAPES) ×6
DRAPE POUCH INSTRU U-SHP 10X18 (DRAPES) ×3 IMPLANT
DRAPE SURG ORHT 6 SPLT 77X108 (DRAPES) ×2 IMPLANT
DRAPE U-SHAPE 47X51 STRL (DRAPES) ×6 IMPLANT
DURAPREP 26ML APPLICATOR (WOUND CARE) ×6 IMPLANT
ELECT CAUTERY BLADE 6.4 (BLADE) ×3 IMPLANT
ELECT REM PT RETURN 9FT ADLT (ELECTROSURGICAL) ×3
ELECTRODE REM PT RTRN 9FT ADLT (ELECTROSURGICAL) ×1 IMPLANT
FEMUR OXINIUM SZ 7 (Knees) ×3 IMPLANT
GAUZE SPONGE 4X4 12PLY STRL (GAUZE/BANDAGES/DRESSINGS) ×3 IMPLANT
GAUZE SPONGE 4X4 12PLY STRL LF (GAUZE/BANDAGES/DRESSINGS) ×3 IMPLANT
GLOVE BIOGEL PI IND STRL 7.0 (GLOVE) ×1 IMPLANT
GLOVE BIOGEL PI INDICATOR 7.0 (GLOVE) ×2
GLOVE ECLIPSE 7.0 STRL STRAW (GLOVE) ×9 IMPLANT
GLOVE SKINSENSE NS SZ7.5 (GLOVE) ×2
GLOVE SKINSENSE STRL SZ7.5 (GLOVE) ×1 IMPLANT
GLOVE SURG SYN 7.5  E (GLOVE) ×8
GLOVE SURG SYN 7.5 E (GLOVE) ×4 IMPLANT
GOWN STRL REIN XL XLG (GOWN DISPOSABLE) ×3 IMPLANT
GOWN STRL REUS W/ TWL LRG LVL3 (GOWN DISPOSABLE) ×1 IMPLANT
GOWN STRL REUS W/TWL LRG LVL3 (GOWN DISPOSABLE) ×2
HANDPIECE INTERPULSE COAX TIP (DISPOSABLE) ×2
HOOD PEEL AWAY FLYTE STAYCOOL (MISCELLANEOUS) ×6 IMPLANT
KIT BASIN OR (CUSTOM PROCEDURE TRAY) ×3 IMPLANT
KIT TURNOVER KIT B (KITS) ×3 IMPLANT
LEGION PS XLPE ARTIC INS 11M (Insert) ×3 IMPLANT
MANIFOLD NEPTUNE II (INSTRUMENTS) ×3 IMPLANT
MARKER SKIN DUAL TIP RULER LAB (MISCELLANEOUS) ×3 IMPLANT
NEEDLE SPNL 18GX3.5 QUINCKE PK (NEEDLE) ×6 IMPLANT
NS IRRIG 1000ML POUR BTL (IV SOLUTION) ×3 IMPLANT
PACK TOTAL JOINT (CUSTOM PROCEDURE TRAY) ×3 IMPLANT
PAD ABD 8X10 STRL (GAUZE/BANDAGES/DRESSINGS) ×3 IMPLANT
PAD ARMBOARD 7.5X6 YLW CONV (MISCELLANEOUS) ×6 IMPLANT
PADDING CAST COTTON 6X4 STRL (CAST SUPPLIES) ×3 IMPLANT
SAW OSC TIP CART 19.5X105X1.3 (SAW) ×3 IMPLANT
SET HNDPC FAN SPRY TIP SCT (DISPOSABLE) ×1 IMPLANT
STAPLER VISISTAT 35W (STAPLE) IMPLANT
SUCTION FRAZIER HANDLE 10FR (MISCELLANEOUS) ×2
SUCTION FRAZIER TIP 10 FR DISP (SUCTIONS) ×3 IMPLANT
SUCTION TUBE FRAZIER 10FR DISP (MISCELLANEOUS) ×1 IMPLANT
SUT ETHILON 2 0 FS 18 (SUTURE) IMPLANT
SUT MNCRL AB 4-0 PS2 18 (SUTURE) IMPLANT
SUT VIC AB 0 CT1 27 (SUTURE) ×6
SUT VIC AB 0 CT1 27XBRD ANBCTR (SUTURE) ×2 IMPLANT
SUT VIC AB 1 CTX 27 (SUTURE) ×9 IMPLANT
SUT VIC AB 2-0 CT1 27 (SUTURE) ×9
SUT VIC AB 2-0 CT1 TAPERPNT 27 (SUTURE) ×3 IMPLANT
SYR 50ML LL SCALE MARK (SYRINGE) ×6 IMPLANT
TOWEL OR 17X24 6PK STRL BLUE (TOWEL DISPOSABLE) ×3 IMPLANT
TOWEL OR 17X26 10 PK STRL BLUE (TOWEL DISPOSABLE) ×3 IMPLANT
TRAY CATH 16FR W/PLASTIC CATH (SET/KITS/TRAYS/PACK) ×3 IMPLANT
UNDERPAD 30X30 (UNDERPADS AND DIAPERS) ×3 IMPLANT
WRAP KNEE MAXI GEL POST OP (GAUZE/BANDAGES/DRESSINGS) ×3 IMPLANT

## 2018-09-01 NOTE — OR Nursing (Signed)
1220:  In&out cath=200cc cyu, per protocol

## 2018-09-01 NOTE — H&P (Signed)
PREOPERATIVE H&P  Chief Complaint: right knee degenerative joint disease  HPI: Ian Wilson is a 57 y.o. male who presents for surgical treatment of right knee degenerative joint disease.  He denies any changes in medical history.  Past Medical History:  Diagnosis Date  . Arthritis    primary localized osteoarthritis B/L knees  . Diverticulosis   . Gout   . Headache   . HTN (hypertension)    h/o- on medication 20 years ago  . Obesity   . Wears glasses    Past Surgical History:  Procedure Laterality Date  . COLONOSCOPY    . TOTAL KNEE ARTHROPLASTY Left 11/21/2017   Procedure: LEFT TOTAL KNEE ARTHROPLASTY;  Surgeon: Tarry KosXu, Ronesha Heenan M, MD;  Location: [facility] OR;  Service: Orthopedics;  Laterality: Left;   Social History   Socioeconomic History  . Marital status: Married    Spouse name: Not on file  . Number of children: 1  . Years of education: Not on file  . Highest education level: Not on file  Occupational History  . 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 Financial resource strain: Not on file  . Food insecurity:    Worry: Not on file    Inability: Not on file  . Transportation needs:    Medical: Not on file    Non-medical: Not on file  Tobacco Use  . Smoking status: Never Smoker  . Smokeless tobacco: Never Used  Substance and Sexual Activity  . Alcohol use: No  . Drug use: No  . Sexual activity: Not on file  Lifestyle  . Physical activity:    Days per week: Not on file    Minutes per session: Not on file  . Stress: Not on file  Relationships  . Social connections:    Talks on phone: Not on file    Gets together: Not on file    Attends religious service: Not on file    Active member of club or organization: Not on file    Attends meetings of clubs or organizations: Not on file    Relationship status: Not on file  Other Topics Concern  . Not on file  Social History Narrative   Lives w/ wife    Daughter [id number]   Family History  Problem Relation Age of  Onset  . CAD Mother        CABG in her 5150s  . Stroke Mother   . Diabetes Mother   . Prostate cancer Father 1070  . Colon cancer Neg Hx    Allergies  Allergen Reactions  . Bee Venom Anaphylaxis  . Peanut Oil Anaphylaxis  . Shellfish-Derived Products Anaphylaxis   Prior to Admission medications   Medication Sig Start Date End Date Taking? Authorizing Provider  allopurinol (ZYLOPRIM) 300 MG tablet Take 1 tablet (300 mg total) daily by mouth. 08/21/17  Yes Paz, Nolon RodJose E, MD  ibuprofen (ADVIL,MOTRIN) 800 MG tablet Take one tab po with food bid prn pain Patient taking differently: Take 800 mg by mouth 2 (two) times daily as needed for headache or moderate pain.  05/13/18  Yes Cristie HemStanbery, Mary L, PA-C     Positive ROS: All other systems have been reviewed and were otherwise negative with the exception of those mentioned in the HPI and as above.  Physical Exam: General: Alert, no acute distress Cardiovascular: No pedal edema Respiratory: No cyanosis, no use of accessory musculature GI: abdomen soft Skin: No lesions in the area of chief  complaint Neurologic: Sensation intact distally Psychiatric: Patient is competent for consent with normal mood and affect Lymphatic: no lymphedema  MUSCULOSKELETAL: exam stable  Assessment: right knee degenerative joint disease  Plan: Plan for Procedure(s): RIGHT TOTAL KNEE ARTHROPLASTY  The risks benefits and alternatives were discussed with the patient including but not limited to the risks of nonoperative treatment, versus surgical intervention including infection, bleeding, nerve injury,  blood clots, cardiopulmonary complications, morbidity, mortality, among others, and they were willing to proceed.   Preoperative templating of the joint replacement has been completed, documented, and submitted to the Operating Room personnel in order to optimize intra-operative equipment management.  Patient's anticipated LOS is less than 2 midnights, meeting these  requirements: - Younger than 71 - Lives within 1 hour of care - Has a competent adult at home to recover with post-op recover - NO history of  - Chronic pain requiring opiods  - Diabetes  - Coronary Artery Disease  - Heart failure  - Heart attack  - Stroke  - DVT/VTE  - Cardiac arrhythmia  - Respiratory Failure/COPD  - Renal failure  - Anemia  - Advanced Liver disease  Glee Arvin, MD   09/01/2018 7:28 AM

## 2018-09-01 NOTE — Progress Notes (Signed)
Orthopedic Tech Progress Note Patient Details:  Ian SiresGeorge D Wilson [birth year] [id number]  CPM Right Knee CPM Right Knee: On Right Knee Flexion (Degrees): 80 Right Knee Extension (Degrees): 0  Post Interventions Patient Tolerated: Well Instructions Provided: Care of device, Adjustment of device  Trinna PostMartinez, Santiaga Butzin J 09/01/2018, 4:29 PM

## 2018-09-01 NOTE — Progress Notes (Signed)
Pt's bp elvated 156/106. Made Dr. Hart RochesterHollis aware. He will treat in the OR. Will continue to monitor.

## 2018-09-01 NOTE — Discharge Instructions (Signed)

## 2018-09-01 NOTE — Transfer of Care (Signed)
Immediate Anesthesia Transfer of Care Note  Patient: Ian SiresGeorge D Wilson  Procedure(s) Performed: RIGHT TOTAL KNEE ARTHROPLASTY (Right Knee)  Patient Location: PACU  Anesthesia Type:General  Level of Consciousness: oriented, drowsy and patient cooperative  Airway & Oxygen Therapy: Patient Spontanous Breathing and Patient connected to face mask oxygen  Post-op Assessment: Report given to RN and Post -op Vital signs reviewed and stable  Post vital signs: Reviewed  Last Vitals:  Vitals Value Taken Time  BP 122/71 09/01/2018 12:43 PM  Temp    Pulse 102 09/01/2018 12:43 PM  Resp 18 09/01/2018 12:43 PM  SpO2 96 % 09/01/2018 12:43 PM  Vitals shown include unvalidated device data.  Last Pain:  Vitals:   09/01/18 0837  TempSrc:   PainSc: 0-No pain      Patients Stated Pain Goal: 3 (09/01/18 [id number])  Complications: No apparent anesthesia complications

## 2018-09-01 NOTE — Anesthesia Postprocedure Evaluation (Signed)
Anesthesia Post Note  Patient: Ian SiresGeorge D Wilson  Procedure(s) Performed: RIGHT TOTAL KNEE ARTHROPLASTY (Right Knee)     Patient location during evaluation: PACU Anesthesia Type: General Level of consciousness: awake and alert Pain management: pain level controlled Vital Signs Assessment: post-procedure vital signs reviewed and stable Respiratory status: spontaneous breathing, nonlabored ventilation, respiratory function stable and patient connected to nasal cannula oxygen Cardiovascular status: blood pressure returned to baseline and stable Postop Assessment: no apparent nausea or vomiting Anesthetic complications: no    Last Vitals:  Vitals:   09/01/18 1430 09/01/18 1500  BP: 135/80 (!) 150/93  Pulse: 95 90  Resp: 18 15  Temp:  36.9 C  SpO2: 97% 95%    Last Pain:  Vitals:   09/01/18 1415  TempSrc:   PainSc: 3                  Shelton SilvasKevin D Garo [place]

## 2018-09-01 NOTE — Op Note (Signed)
Total Knee Arthroplasty Procedure Note  Preoperative diagnosis: Right knee osteoarthritis  Postoperative diagnosis:same  Operative procedure: Right total knee arthroplasty. CPT [phone]  Surgeon: N. Glee Arvin, MD  Assist: Oneal Grout, PA-C; necessary for the timely completion of procedure and due to complexity of procedure.  Anesthesia: Spinal, regional  Tourniquet time: 75 mins  Implants used: [employer] Femur: PS 7 Tibia: 8 Patella: 38 mm, 9 thick Polyethylene: 11 mm  Indication: ANTAEUS KAREL is a 57 y.o. year old male with a history of knee pain. Having failed conservative management, the patient elected to proceed with a total knee arthroplasty.  We have reviewed the risk and benefits of the surgery and they elected to proceed after voicing understanding.  Procedure:  After informed consent was obtained and understanding of the risk were voiced including but not limited to bleeding, infection, damage to surrounding structures including nerves and vessels, blood clots, leg length inequality and the failure to achieve desired results, the operative extremity was marked with verbal confirmation of the patient in the holding area.   The patient was then brought to the operating room and transported to the operating room table in the supine position.  A tourniquet was applied to the operative extremity around the upper thigh. The operative limb was then prepped and draped in the usual sterile fashion and preoperative antibiotics were administered.  A time out was performed prior to the start of surgery confirming the correct extremity, preoperative antibiotic administration, as well as team members, implants and instruments available for the case. Correct surgical site was also confirmed with preoperative radiographs. The limb was then elevated for exsanguination and the tourniquet was inflated. A midline incision was made and a standard medial parapatellar  approach was performed.  The patella was prepared and sized to a 38 mm.  A cover was placed on the patella for protection from retractors.  We then turned our attention to the femur. Posterior cruciate ligament was sacrificed. Start site was drilled in the femur and the intramedullary distal femoral cutting guide was placed, set at 3 degrees valgus, taking 9 mm of distal resection. The distal cut was made. Osteophytes were then removed. Next, the proximal tibial cutting guide was placed with appropriate slope, varus/valgus alignment and depth of resection. The proximal tibial cut was made. Gap blocks were then used to assess the extension gap and alignment, and appropriate soft tissue releases were performed. Attention was turned back to the femur, which was sized using the sizing guide to a size 7. Appropriate rotation of the femoral component was determined using epicondylar axis, Whiteside's line, and assessing the flexion gap under ligament tension. The appropriate size 4-in-1 cutting block was placed and cuts were made. Posterior femoral osteophytes and uncapped bone were then removed with the curved osteotome. The tibia was sized for a size 8 component. The femoral box-cutting guide was placed and prepared for a PS femoral component. Trial components were placed, and stability was checked in full extension, mid-flexion, and deep flexion. Proper tibial rotation was determined and marked.  The patella tracked well without a lateral release. Trial components were then removed and tibial preparation performed. A posterior capsular injection comprising of 20 cc of 1.3% exparel and 40 cc of normal saline was performed for postoperative pain control. The bony surfaces were irrigated with a pulse lavage and then dried. Bone cement was vacuum mixed on the back table, and the final components sized above were cemented into place. After  cement had finished curing, excess cement was removed. The stability of the  construct was re-evaluated throughout a range of motion and found to be acceptable. The trial liner was removed, the knee was copiously irrigated, and the knee was re-evaluated for any excess bone debris. The real polyethylene liner, 11 mm thick, was inserted and checked to ensure the locking mechanism had engaged appropriately. The tourniquet was deflated and hemostasis was achieved. The wound was irrigated with normal saline.  One gram of vancomycin powder was placed in the surgical bed. A drain was not placed. Capsular closure was performed with a #1 vicryl, subcutaneous fat closed with a 0 vicryl suture, then subcutaneous tissue closed with interrupted 2.0 vicryl suture. The skin was then closed with a 3.0 monocryl. A sterile dressing was applied.  The patient was awakened in the operating room and taken to recovery in stable condition. All sponge, needle, and instrument counts were correct at the end of the case.  Position: supine  Complications: none.  Time Out: performed   Drains/Packing: none  Estimated blood loss: minimal  Returned to Recovery Room: in good condition.   Antibiotics: yes   Mechanical VTE (DVT) Prophylaxis: sequential compression devices, TED thigh-high  Chemical VTE (DVT) Prophylaxis: aspirin  Fluid Replacement  Crystalloid: see anesthesia record Blood: none  FFP: none   Specimens Removed: 1 to pathology   Sponge and Instrument Count Correct? yes   PACU: portable radiograph - knee AP and Lateral   Admission: inpatient status  Plan/RTC: Return in 2 weeks for wound check.   Weight Bearing/Load Lower Extremity: full   N. Glee ArvinMichael , MD [facility] [phone] 11:59 AM

## 2018-09-01 NOTE — Anesthesia Procedure Notes (Signed)
Procedure Name: LMA Insertion Date/Time: 09/01/2018 10:02 AM Performed by: Lovie Cholock, Kyrstan Gotwalt K, CRNA Pre-anesthesia Checklist: Patient identified, Emergency Drugs available, Suction available and Patient being monitored Patient Re-evaluated:Patient Re-evaluated prior to induction Oxygen Delivery Method: Circle System Utilized Preoxygenation: Pre-oxygenation with 100% oxygen Induction Type: IV induction Ventilation: Mask ventilation without difficulty LMA: LMA inserted LMA Size: 5.0 Number of attempts: 1 Airway Equipment and Method: Bite block Placement Confirmation: positive ETCO2 and breath sounds checked- equal and bilateral Tube secured with: Tape Dental Injury: Teeth and Oropharynx as per pre-operative assessment

## 2018-09-01 NOTE — Anesthesia Procedure Notes (Signed)
Anesthesia Regional Block: Adductor canal block   Pre-Anesthetic Checklist: ,, timeout performed, Correct Patient, Correct Site, Correct Laterality, Correct Procedure, Correct Position, site marked, Risks and benefits discussed,  Surgical consent,  Pre-op evaluation,  At surgeon's request and post-op pain management  Laterality: Right  Prep: chloraprep       Needles:  Injection technique: Single-shot  Needle Type: Echogenic Stimulator Needle     Needle Length: 9cm  Needle Gauge: 21     Additional Needles:   Procedures:,,,, ultrasound used (permanent image in chart),,,,  Narrative:  Start time: 09/01/2018 8:55 AM End time: 09/01/2018 9:05 AM Injection made incrementally with aspirations every 5 mL.  Performed by: Personally  Anesthesiologist: Shelton SilvasHollis,  D, MD  Additional Notes: Patient tolerated the procedure well. Local anesthetic introduced in an incremental fashion under minimal resistance after negative aspirations. No paresthesias were elicited. After completion of the procedure, no acute issues were identified and patient continued to be monitored by RN.

## 2018-09-02 ENCOUNTER — Encounter (HOSPITAL_COMMUNITY): Payer: Self-pay | Admitting: Orthopaedic Surgery

## 2018-09-02 LAB — BASIC METABOLIC PANEL
ANION GAP: 6 (ref 5–15)
BUN: 17 mg/dL (ref 6–20)
CHLORIDE: 105 mmol/L (ref 98–111)
CO2: 25 mmol/L (ref 22–32)
Calcium: 8.3 mg/dL — ABNORMAL LOW (ref 8.9–10.3)
Creatinine, Ser: 1.36 mg/dL — ABNORMAL HIGH (ref 0.61–1.24)
GFR, EST NON AFRICAN AMERICAN: 57 mL/min — AB (ref >60)
Glucose, Bld: 128 mg/dL — ABNORMAL HIGH (ref 70–99)
Potassium: 4.7 mmol/L (ref 3.5–5.1)
SODIUM: 136 mmol/L (ref 135–145)

## 2018-09-02 LAB — CBC
HCT: 43.9 % (ref 39.0–52.0)
HEMOGLOBIN: 13.2 g/dL (ref 13.0–17.0)
MCH: 28.3 pg (ref 26.0–34.0)
MCHC: 30.1 g/dL (ref 30.0–36.0)
MCV: 94.2 fL (ref 80.0–100.0)
NRBC: 0 % (ref 0.0–0.2)
PLATELETS: 236 10*3/uL (ref 150–400)
RBC: 4.66 MIL/uL (ref 4.22–5.81)
RDW: 12.9 % (ref 11.5–15.5)
WBC: 10.1 10*3/uL (ref 4.0–10.5)

## 2018-09-02 NOTE — Evaluation (Signed)
Physical Therapy Evaluation Patient Details Name: Ian Wilson MRN: [id number] DOB: Aug 24, 1961 Today's Date: 09/02/2018   History of Present Illness  Pt is 57 y.o. male s/p right TKA (09/01/18) secondary to osteoarthritis. 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 Pt is eager to work with therapy to optimize healing and reports goal of losing weight once pain is manageable. Pt completed all mobility with hands on min guard for safety, with additional reinforcement needed to move slower for safety, as patient is quick to move. He reports that he knew what to expect having had left TKA in February of this year, and desires to go to outpatient therapy at DC home. PT is in agreeance with DC plan as patient exhibits good functional mobility and self-care habits, and is highly motivated to return to PLOF. PT will continue to follow acutely.    Follow Up Recommendations Follow surgeon's recommendation for DC plan and follow-up therapies;Supervision for mobility/OOB    Equipment Recommendations  None recommended by PT       Precautions / Restrictions Restrictions Weight Bearing Restrictions: Yes RLE Weight Bearing: Weight bearing as tolerated      Mobility  Bed Mobility               General bed mobility comments: pt in chair at start of treatment  Transfers Overall transfer level: Needs assistance Equipment used: Rolling walker (2 wheeled) Transfers: Sit to/from Stand Sit to Stand: Min guard         General transfer comment: hands on min guard for safety. Pt eager to move, standing without RW in front of him. Cuing and education on importance of staying within RW during all movements.   Ambulation/Gait Ambulation/Gait assistance: Min guard Gait  Distance (Feet): 85 Feet Assistive device: Rolling walker (2 wheeled) Gait Pattern/deviations: Step-through pattern;Antalgic   Gait velocity interpretation: >2.62 ft/sec, indicative of community ambulatory General Gait Details: Pt ambulating with hands on min guard for safety, lacking heel strike bilaterally L>R with occasional hip hiking on LLE. Pt able to ambulate with heel strike, needs additional cuing to be reminded of this. vc also given to stay within RW and decrease tension through UE.      Balance Overall balance assessment: Needs assistance Sitting-balance support: No upper extremity supported;Feet supported Sitting balance-Leahy Scale: Good     Standing balance support: No upper extremity supported Standing balance-Leahy Scale: Fair                               Pertinent Vitals/Pain Pain Assessment: Faces Faces Pain Scale: Hurts little more Pain Location: right knee Pain Descriptors / Indicators: Discomfort;Sore;Tightness;Sharp Pain Intervention(s): Limited activity within patient's tolerance;Monitored during session;Repositioned    Home Living Family/patient expects to be discharged to:: Private residence Living Arrangements: Spouse/significant other;Children;Other relatives;Other (Comment)(Pt is a foster parent) Available Help at Discharge: Family;Available PRN/intermittently Type of Home: House Home Access: Stairs to enter   [employer] of Steps: 2 Home Layout: Two level;1/2 bath on main level;Bed/bath upstairs Home Equipment: Walker - 2 wheels;Crutches;Cane - single point Additional Comments: 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 Works in [employer] so is on his feet a lot.        Extremity/Trunk Assessment   Upper Extremity  Assessment Upper Extremity Assessment: Overall WFL for tasks assessed    Lower Extremity Assessment Lower Extremity Assessment: RLE deficits/detail RLE Deficits / Details: s/p right TKA     Cervical / Trunk Assessment Cervical / Trunk Assessment: Normal  Communication   Communication: No difficulties  Cognition Arousal/Alertness: Awake/alert Behavior During Therapy: WFL for tasks assessed/performed Overall Cognitive Status: Within Functional Limits for tasks assessed                                        General Comments General comments (skin integrity, edema, etc.): Unable to assess incisional site due to dressing. Pt eager to work with therapy and optimize healing, further cuing and education needed to improve safety with mobility as patient is eager to get up and move.      Exercises Total Joint Exercises Ankle Circles/Pumps: AROM;Both;5 reps Heel Slides: AROM;Right;5 reps;Seated Long Arc Quad: AROM;Right;5 reps;Seated Goniometric ROM: Knee Flexion: 110 deg; Knee Extension: 3 deg   Assessment/Plan    PT Assessment Patient needs continued PT services  PT Problem List Decreased strength;Decreased range of motion;Decreased activity tolerance;Decreased balance;Decreased mobility;Decreased coordination;Decreased knowledge of use of DME;Obesity;Pain       PT Treatment Interventions DME instruction;Gait training;Stair training;Functional mobility training;Therapeutic activities;Therapeutic exercise;Balance training;Neuromuscular re-education;Patient/family education    PT Goals (Current goals can be found in the Care Plan section)  Acute Rehab PT Goals Patient Stated Goal: return home and work towards losing weight PT Goal Formulation: With patient Time For Goal Achievement: 09/16/18 Potential to Achieve Goals: Good    Frequency 7X/week    AM-PAC PT "6 Clicks" Daily Activity  Outcome Measure Difficulty turning over in bed (including adjusting  bedclothes, sheets and blankets)?: A Little Difficulty moving from lying on back to sitting on the side of the bed? : A Lot Difficulty sitting down on and standing up from a chair with arms (e.g., wheelchair, bedside commode, etc,.)?: A Lot Help needed moving to and from a bed to chair (including a wheelchair)?: A Little Help needed walking in hospital room?: A Little Help needed climbing 3-5 steps with a railing? : A Lot 6 Click Score: 15    End of Session Equipment Utilized During Treatment: Gait belt Activity Tolerance: Patient tolerated treatment well Patient left: in chair;with call bell/phone within reach;Other (comment)(with RLE elevated on bone foam)   PT Visit Diagnosis: Other abnormalities of gait and mobility (R26.89);Muscle weakness (generalized) (M62.81);Difficulty in walking, not elsewhere classified (R26.2);Pain Pain - Right/Left: Right Pain - part of body: Knee    Time: 1020-1053 PT Time Calculation (min) (ACUTE ONLY): 33 min   Charges:   PT Evaluation $PT Eval Low Complexity: 1 Low PT Treatments $Gait Training: 8-22 mins        Rinaldo Cloudourtney Signa Cheek, SPT Acute Rehabilitation Services Office [phone]   Rinaldo CloudCourtney Gertha Lichtenberg 09/02/2018, 2:24 PM

## 2018-09-02 NOTE — Progress Notes (Signed)
Subjective: 1 Day Post-Op Procedure(s) (LRB): RIGHT TOTAL KNEE ARTHROPLASTY (Right) Patient reports pain as mild.  Patient with nausea but no vomiting.  Otherwise, feeling ok   Objective: Vital signs in last 24 hours: Temp:  [97.3 F (36.3 C)-99.4 F (37.4 C)] 97.3 F (36.3 C) (11/19 0420) Pulse Rate:  [83-108] 83 (11/19 0420) Resp:  [13-25] 18 (11/19 0420) BP: (122-156)/(71-106) 123/79 (11/19 0420) SpO2:  [93 %-100 %] 94 % (11/19 0420)  Intake/Output from previous day: 11/18 0701 - 11/19 0700 In: 2060 [P.O.:360; I.V.:1700] Out: 900 [Urine:850; Blood:50] Intake/Output this shift: No intake/output data recorded.  Recent Labs    09/02/18 0131  HGB 13.2   Recent Labs    09/02/18 0131  WBC 10.1  RBC 4.66  HCT 43.9  PLT 236   Recent Labs    09/02/18 0131  NA 136  K 4.7  CL 105  CO2 25  BUN 17  CREATININE 1.36*  GLUCOSE 128*  CALCIUM 8.3*   No results for input(s): LABPT, INR in the last 72 hours.  Neurologically intact Neurovascular intact Sensation intact distally Intact pulses distally Dorsiflexion/Plantar flexion intact Incision: dressing C/D/I No cellulitis present Compartment soft  Anticipated LOS equal to or greater than 2 midnights due to - Age 56 and older with one or more of the following:  - Obesity  - Expected need for hospital services (PT, OT, Nursing) required for safe  discharge  - Anticipated need for postoperative skilled nursing care or inpatient rehab  - Active co-morbidities: None OR   - Unanticipated findings during/Post Surgery: Slow post-op progression: GI, pain control, mobility  - Patient is a high risk of re-admission due to: None   Assessment/Plan: 1 Day Post-Op Procedure(s) (LRB): RIGHT TOTAL KNEE ARTHROPLASTY (Right) Advance diet Up with therapy D/C IV fluids Plan for discharge tomorrow home with hhpt WBAT RLE    Cristie HemMary L Yosselin Zoeller 09/02/2018, 8:25 AM

## 2018-09-02 NOTE — Care Management Note (Signed)
Case Management Note  Patient Details  Name: Ian Wilson MRN: [id number] Date of Birth: 08-Dec-1960  Subjective/Objective:  57 yr old gentleman s/p right total knee arthroplasty.               Action/Plan: Case manager spoke with patient concerning discharge plan and DME. Patient was preoperatively setup with [facility], no changes. He has all DME from Left Knee replacement in February. Patient will have family support at discharge.    Expected Discharge Date:   09/03/18               Expected Discharge Plan:  Home w Home Health Services  In-House Referral:  NA  Discharge planning Services  CM Consult  Post Acute Care Choice:  Home Health Choice offered to:  Patient  DME Arranged:  (has All DME) DME Agency:  NA  HH Arranged:  PT HH Agency:  [facility]  Status of Service:  Completed, signed off  If discussed at [employer] Meetings, dates discussed:    Additional Comments:  [name], [name], RN 09/02/2018, 4:04 PM

## 2018-09-02 NOTE — Progress Notes (Signed)
Physical Therapy Treatment Patient Details Name: Ian Wilson MRN: [id number] DOB: 1961/01/16 Today's Date: 09/02/2018    History of Present Illness Pt is 57 y.o. male s/p right TKA (09/01/18) secondary to osteoarthritis. 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     PT Comments    Pt continues to have minimal pain this afternoon, reporting that he has stayed mobile throughout the day. He continues to complete mobility with hands on min guard for safety. Pt shows increased safety with use of RW and ability to self-limit activity within tolerable range, minimal cuing needed for management of RW. Educated on HEP and pain management. DC plan remains appropriate, PT will continue to follow acutely.    Follow Up Recommendations  Follow surgeon's recommendation for DC plan and follow-up therapies;Supervision for mobility/OOB     Equipment Recommendations  None recommended by PT       Precautions / Restrictions Restrictions Weight Bearing Restrictions: Yes RLE Weight Bearing: Weight bearing as tolerated    Mobility  Transfers Overall transfer level: Needs assistance Equipment used: Rolling walker (2 wheeled) Transfers: Sit to/from Stand Sit to Stand: Min guard         General transfer comment: hands on min guard for safety. vc to bring RW all the way back with him before starting to sit down in chair.   Ambulation/Gait Ambulation/Gait assistance: Min guard Gait Distance (Feet): 150 Feet Assistive device: Rolling walker (2 wheeled) Gait Pattern/deviations: Step-through pattern;Antalgic   Gait velocity interpretation: >2.62 ft/sec, indicative of community ambulatory General Gait Details: hands on min guard for safety. Pt with decreased heel strike bilaterally, improves some with cuing. vc given to stay within RW and decrease tension through UE. Pt able to determine when he has onset of fatigue to  turn around and return to room for rest break.       Balance Overall balance assessment: Needs assistance Sitting-balance support: No upper extremity supported;Feet supported Sitting balance-Leahy Scale: Good     Standing balance support: No upper extremity supported Standing balance-Leahy Scale: Fair                              Cognition Arousal/Alertness: Awake/alert Behavior During Therapy: WFL for tasks assessed/performed Overall Cognitive Status: Within Functional Limits for tasks assessed                                        Exercises Total Joint Exercises Ankle Circles/Pumps: AROM;Both;10 reps Quad Sets: AROM;Right;5 reps;Seated(long sitting) Straight Leg Raises: AROM;Right;5 reps;Seated(long sitting) Long Arc Quad: AROM;Right;5 reps;Seated  All exercises completed in decreased ROM, limited by some pain, edema, and dressing.    General Comments General comments (skin integrity, edema, etc.): Pt continues to have minimal pain, educated on importance of pain management and HEP. Discussed initiation of stair training tomorrow, pt states his wife has arranged a "back-up plan" on the first floor if he is unable to ascend/descend stairs safely to get to his bedroom.       Pertinent Vitals/Pain Pain Assessment: Faces Faces Pain Scale: Hurts a little bit Pain Location: right knee Pain Descriptors / Indicators: Discomfort;Sore;Tightness Pain Intervention(s): Limited activity within patient's tolerance;Monitored during session;Repositioned           PT Goals (current goals can now be found in the care plan  section) Acute Rehab PT Goals Patient Stated Goal: return home and work towards losing weight PT Goal Formulation: With patient Time For Goal Achievement: 09/16/18 Potential to Achieve Goals: Good Progress towards PT goals: Progressing toward goals    Frequency    7X/week      PT Plan Current plan remains appropriate        AM-PAC PT "6 Clicks" Daily Activity  Outcome Measure  Difficulty turning over in bed (including adjusting bedclothes, sheets and blankets)?: A Little Difficulty moving from lying on back to sitting on the side of the bed? : A Lot Difficulty sitting down on and standing up from a chair with arms (e.g., wheelchair, bedside commode, etc,.)?: A Lot Help needed moving to and from a bed to chair (including a wheelchair)?: A Little Help needed walking in hospital room?: A Little Help needed climbing 3-5 steps with a railing? : A Lot 6 Click Score: 15    End of Session Equipment Utilized During Treatment: Gait belt Activity Tolerance: Patient tolerated treatment well Patient left: in chair;with call bell/phone within reach;Other (comment)(with RLE elevated on bone foam)   PT Visit Diagnosis: Other abnormalities of gait and mobility (R26.89);Muscle weakness (generalized) (M62.81);Difficulty in walking, not elsewhere classified (R26.2);Pain Pain - Right/Left: Right Pain - part of body: Knee     Time: [id number] PT Time Calculation (min) (ACUTE ONLY): 17 min  Charges:  $Gait Training: 8-22 mins                     Rinaldo Cloudourtney Rhiley Tarver, SPT Acute Rehabilitation Services Office [phone]    Rinaldo CloudCourtney Teia Freitas 09/02/2018, 4:49 PM

## 2018-09-03 ENCOUNTER — Other Ambulatory Visit: Payer: Self-pay

## 2018-09-03 NOTE — Progress Notes (Signed)
Subjective: 2 Days Post-Op Procedure(s) (LRB): RIGHT TOTAL KNEE ARTHROPLASTY (Right) Patient reports pain as mild.   Doing well this am.   Objective: Vital signs in last 24 hours: Temp:  [97.6 F (36.4 C)-98.4 F (36.9 C)] 97.6 F (36.4 C) (11/20 0453) Pulse Rate:  [79-97] 79 (11/20 0453) Resp:  [16-20] 16 (11/20 0453) BP: (126-143)/(88-100) 126/88 (11/20 0453) SpO2:  [95 %-100 %] 95 % (11/20 0453)  Intake/Output from previous day: 11/19 0701 - 11/20 0700 In: 960 [P.O.:960] Out: -  Intake/Output this shift: No intake/output data recorded.  Recent Labs    09/02/18 0131  HGB 13.2   Recent Labs    09/02/18 0131  WBC 10.1  RBC 4.66  HCT 43.9  PLT 236   Recent Labs    09/02/18 0131  NA 136  K 4.7  CL 105  CO2 25  BUN 17  CREATININE 1.36*  GLUCOSE 128*  CALCIUM 8.3*   No results for input(s): LABPT, INR in the last 72 hours.  Neurologically intact Neurovascular intact Sensation intact distally Intact pulses distally Dorsiflexion/Plantar flexion intact Incision: dressing C/D/I No cellulitis present Compartment soft  Anticipated LOS equal to or greater than 2 midnights due to - Age 56 and older with one or more of the following:  - Obesity  - Expected need for hospital services (PT, OT, Nursing) required for safe  discharge  - Anticipated need for postoperative skilled nursing care or inpatient rehab  - Active co-morbidities: None OR   - Unanticipated findings during/Post Surgery: Slow post-op progression: GI, pain control, mobility  - Patient is a high risk of re-admission due to: None   Assessment/Plan: 2 Days Post-Op Procedure(s) (LRB): RIGHT TOTAL KNEE ARTHROPLASTY (Right) Advance diet Up with therapy Plan for discharge tomorrow home with hhpt WBAT RLE Dressing changed by me today Please apply thigh-high ted hose to RLE    Cristie HemMary L Amesha Bailey 09/03/2018, 8:11 AM

## 2018-09-03 NOTE — Progress Notes (Signed)
Physical Therapy Treatment Patient Details Name: Ian SiresGeorge D Wilson MRN: [id number] DOB: 1961/02/10 Today's Date: 09/03/2018    History of Present Illness Pt is 57 y.o. male s/p right TKA (09/01/18) secondary to osteoarthritis. 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     PT Comments    Pt reports increased fatigue at start of session. Able to navigate flight of stairs with one rail, hands on min guard, and min cuing needed for sequencing. Pt ambulating shorter distance due to fatigue, with hands on min guard and cues for posture and RW management. Continued reinforcement needed for proper form with gait, but pt is good to self-correct as needed. DC plan remains appropriate, PT will continue to follow acutely.   Follow Up Recommendations  Follow surgeon's recommendation for DC plan and follow-up therapies;Supervision for mobility/OOB     Equipment Recommendations  None recommended by PT       Precautions / Restrictions Restrictions Weight Bearing Restrictions: Yes RLE Weight Bearing: Weight bearing as tolerated    Mobility  Bed Mobility               General bed mobility comments: pt sitting on EOB at start of treatment  Transfers Overall transfer level: Needs assistance Equipment used: Rolling walker (2 wheeled) Transfers: Sit to/from Stand Sit to Stand: Min guard         General transfer comment: hands on min guard for safety. vc for hand placement, and to wait for RW to be in front of him before standing   Ambulation/Gait Ambulation/Gait assistance: Min guard Gait Distance (Feet): 230 Feet Assistive device: Rolling walker (2 wheeled) Gait Pattern/deviations: Step-through pattern;Antalgic;Trunk flexed   Gait velocity interpretation: >2.62 ft/sec, indicative of community ambulatory General Gait Details: hands on min guard for safety. Pt ambulating with improved posture and better heel  strike bilaterally, and frequently self-corrects as needed. Min cuing to keep RW closer, relax shoulders, and to keep chest up and move pelvis forward to improve trunk flexion.   Stairs Stairs: Yes Stairs assistance: Min guard Stair Management: One rail Right Number of Stairs: 14 General stair comments: Pt ascending/descending flight of stairs with hands on min guard for safety, with improved form and sequencing. Pt reports stair navigation is "easier than this morning".       Balance Overall balance assessment: Needs assistance Sitting-balance support: No upper extremity supported;Feet supported Sitting balance-Leahy Scale: Good     Standing balance support: No upper extremity supported Standing balance-Leahy Scale: Good                              Cognition Arousal/Alertness: Awake/alert Behavior During Therapy: WFL for tasks assessed/performed Overall Cognitive Status: Within Functional Limits for tasks assessed                                        Exercises Total Joint Exercises Ankle Circles/Pumps: AROM;Both;10 reps;Seated Heel Slides: AROM;Right;5 reps;Seated Goniometric ROM: Knee Flexion: 100 deg; Knee Extension: 2 deg Exercises completed in decreased ROM limited by pain, edema, and dressings. Educated on importance of moving through full ROM with each exercise.    General Comments General comments (skin integrity, edema, etc.): Pt's dressing dry and in tact with ted hose placed over it. Education provided on role of compression stockings and suggested that he  would benefit from use of them at home as he reports swollen ankles at the end of each day.       Pertinent Vitals/Pain Pain Assessment: Faces Faces Pain Scale: Hurts little more Pain Location: right knee Pain Descriptors / Indicators: Sore;Pressure;Tightness;Tiring;Grimacing Pain Intervention(s): Limited activity within patient's tolerance;Monitored during  session;Repositioned;Ice applied           PT Goals (current goals can now be found in the care plan section) Acute Rehab PT Goals Patient Stated Goal: return home and work towards losing weight PT Goal Formulation: With patient Time For Goal Achievement: 09/16/18 Potential to Achieve Goals: Good Progress towards PT goals: Progressing toward goals    Frequency    7X/week      PT Plan Current plan remains appropriate       AM-PAC PT "6 Clicks" Daily Activity  Outcome Measure  Difficulty turning over in bed (including adjusting bedclothes, sheets and blankets)?: A Little Difficulty moving from lying on back to sitting on the side of the bed? : A Lot Difficulty sitting down on and standing up from a chair with arms (e.g., wheelchair, bedside commode, etc,.)?: A Lot Help needed moving to and from a bed to chair (including a wheelchair)?: A Little Help needed walking in hospital room?: A Little Help needed climbing 3-5 steps with a railing? : A Lot 6 Click Score: 15    End of Session Equipment Utilized During Treatment: Gait belt Activity Tolerance: Patient tolerated treatment well Patient left: in chair;with call bell/phone within reach;Other (comment)(with ice applied)   PT Visit Diagnosis: Other abnormalities of gait and mobility (R26.89);Muscle weakness (generalized) (M62.81);Difficulty in walking, not elsewhere classified (R26.2);Pain Pain - Right/Left: Right Pain - part of body: Knee     Time: 0307-0329 PT Time Calculation (min) (ACUTE ONLY): 22 min  Charges:  $Gait Training: 8-22 mins                     Rinaldo Cloud, SPT Acute Rehabilitation Services Office [phone]    Rinaldo Cloud 09/03/2018, 4:41 PM

## 2018-09-03 NOTE — Progress Notes (Signed)
Physical Therapy Treatment Patient Details Name: Ian SiresGeorge D Kertesz MRN: [id number] DOB: 01-05-1961 Today's Date: 09/03/2018    History of Present Illness Pt is 57 y.o. male s/p right TKA (09/01/18) secondary to osteoarthritis. 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     PT Comments    Pt seated on EOB at start of session, eager to work with therapy. Initiated stair training today with education on proper form to ascend/descend flight of stairs at home, and management of RW for half-step at entrance into home. Pt navigating stairs with hands on min guard for safety, using both rails progressing to use of one rail. Min cuing needed for leading with correct foot. Pt ambulating longer distances today with improved posture and heel strike. He reports increased fatigue of RLE at conclusion of session. DC plan remains appropriate. PT will continue to follow acutely.  Follow Up Recommendations  Follow surgeon's recommendation for DC plan and follow-up therapies;Supervision for mobility/OOB     Equipment Recommendations  None recommended by PT       Precautions / Restrictions Restrictions Weight Bearing Restrictions: Yes RLE Weight Bearing: Weight bearing as tolerated    Mobility  Bed Mobility               General bed mobility comments: pt sitting on EOB at start of treatment  Transfers Overall transfer level: Needs assistance Equipment used: Rolling walker (2 wheeled) Transfers: Sit to/from Stand Sit to Stand: Min guard         General transfer comment: hands on min guard for safety. vc for hand placement, to wait for RW to be in front of him before standing, and to keep RW closer to him during transfers as he has a tendency to push it too far in front of him   Ambulation/Gait Ambulation/Gait assistance: Min guard Gait Distance (Feet): 430 Feet Assistive device: Rolling walker (2 wheeled) Gait  Pattern/deviations: Step-through pattern;Antalgic   Gait velocity interpretation: >2.62 ft/sec, indicative of community ambulatory General Gait Details: hands on min guard for safety. Pt ambulating with improved posture and better heel strike bilaterally. Min cuing to keep RW closer, relax shoulders, and work on full knee extension to improve heel strike bilaterally.   Stairs Stairs: Yes Stairs assistance: Min guard Stair Management: One rail Right;Two rails;With walker Number of Stairs: 4 General stair comments: Pt able to navigate half step with RW with hands on min guard to enter home, and ascends/descends regular steps with hands on min guard for safety, use of both rails at first progressing to use of right rail only. Min cuing needed for sequencing during ascent/descent.      Balance Overall balance assessment: Needs assistance Sitting-balance support: No upper extremity supported;Feet supported Sitting balance-Leahy Scale: Good     Standing balance support: No upper extremity supported Standing balance-Leahy Scale: Good                              Cognition Arousal/Alertness: Awake/alert Behavior During Therapy: WFL for tasks assessed/performed Overall Cognitive Status: Within Functional Limits for tasks assessed                                           General Comments General comments (skin integrity, edema, etc.): Pt's dressing is starting to come off  following removal of ace wrap. Some edema noted around operative site, no signs of bleeding present through dressing. Pt educated on proper form with stair navigation.      Pertinent Vitals/Pain Pain Assessment: Faces Faces Pain Scale: Hurts little more Pain Location: right knee Pain Descriptors / Indicators: Sore;Pressure;Tightness Pain Intervention(s): Limited activity within patient's tolerance;Monitored during session;Repositioned           PT Goals (current goals can now be  found in the care plan section) Acute Rehab PT Goals Patient Stated Goal: return home and work towards losing weight PT Goal Formulation: With patient Time For Goal Achievement: 09/16/18 Potential to Achieve Goals: Good Progress towards PT goals: Progressing toward goals    Frequency    7X/week      PT Plan Current plan remains appropriate       AM-PAC PT "6 Clicks" Daily Activity  Outcome Measure  Difficulty turning over in bed (including adjusting bedclothes, sheets and blankets)?: A Little Difficulty moving from lying on back to sitting on the side of the bed? : A Lot Difficulty sitting down on and standing up from a chair with arms (e.g., wheelchair, bedside commode, etc,.)?: A Lot Help needed moving to and from a bed to chair (including a wheelchair)?: A Little Help needed walking in hospital room?: A Little Help needed climbing 3-5 steps with a railing? : A Lot 6 Click Score: 15    End of Session Equipment Utilized During Treatment: Gait belt Activity Tolerance: Patient tolerated treatment well Patient left: in chair;with call bell/phone within reach   PT Visit Diagnosis: Other abnormalities of gait and mobility (R26.89);Muscle weakness (generalized) (M62.81);Difficulty in walking, not elsewhere classified (R26.2);Pain Pain - Right/Left: Right Pain - part of body: Knee     Time: 1610-9604 PT Time Calculation (min) (ACUTE ONLY): 28 min  Charges:  $Gait Training: 23-37 mins                     Rinaldo Cloud, SPT Acute Rehabilitation Services Office [phone]    Rinaldo Cloud 09/03/2018, 2:32 PM

## 2018-09-04 ENCOUNTER — Telehealth (INDEPENDENT_AMBULATORY_CARE_PROVIDER_SITE_OTHER): Payer: Self-pay

## 2018-09-04 NOTE — Progress Notes (Signed)
Physical Therapy Treatment Patient Details Name: Ian Wilson MRN: [id number] DOB: [birth year] Today's Date: 09/04/2018    History of Present Illness Pt is 57 y.o. male s/p right TKA (09/01/18) secondary to osteoarthritis. 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     PT Comments    Pt reports increased pain today and that he took pain meds earlier this morning. Pt able to perform "car transfer" in bed with supervision and cuing for safe hand placement. Pt ambulating with hands on min guard for safety, with emphasis on working towards improved heel strike bilaterally. DC plan remains appropriate at this time, with no further questions for PT.   Follow Up Recommendations  Follow surgeon's recommendation for DC plan and follow-up therapies;Supervision for mobility/OOB     Equipment Recommendations  None recommended by PT       Precautions / Restrictions Restrictions Weight Bearing Restrictions: Yes RLE Weight Bearing: Weight bearing as tolerated    Mobility  Bed Mobility Overal bed mobility: Needs Assistance Bed Mobility: Supine to Sit;Sit to Supine     Supine to sit: Supervision;HOB elevated Sit to supine: Supervision;HOB elevated   General bed mobility comments: Educated pt on car transfer, pt able to complete with min cuing and supervision for safety.  Transfers Overall transfer level: Needs assistance Equipment used: Rolling walker (2 wheeled) Transfers: Sit to/from Stand Sit to Stand: Min guard         General transfer comment: min guard for safety. vc for hand placement, and to keep RW closer to him for safety  Ambulation/Gait Ambulation/Gait assistance: Min guard Gait Distance (Feet): 400 Feet Assistive device: Rolling walker (2 wheeled) Gait Pattern/deviations: Step-through pattern;Antalgic;Trunk flexed   Gait velocity interpretation: >2.62 ft/sec, indicative of community  ambulatory General Gait Details: hands on min guard for safety. Pt ambulating with improved posture and heel strike, noting more pressure through right knee today. Occasional cuing for shoulders, keeping chest up, and improving heel strike. Pt is good at self-correcting as needed.       Balance Overall balance assessment: Needs assistance Sitting-balance support: No upper extremity supported;Feet supported Sitting balance-Leahy Scale: Good     Standing balance support: No upper extremity supported Standing balance-Leahy Scale: Good                              Cognition Arousal/Alertness: Awake/alert Behavior During Therapy: WFL for tasks assessed/performed Overall Cognitive Status: Within Functional Limits for tasks assessed                                        Exercises Total Joint Exercises Ankle Circles/Pumps: AROM;Both;5 reps;Supine Heel Slides: AROM;Right;5 reps;Supine Goniometric ROM: Knee Flexion: 88 deg; Knee Extension: 5 deg    General Comments General comments (skin integrity, edema, etc.): Dressing in tact with edema present around incisional site. Education provided on car transfer, pain management and rest between periods of mobility.      Pertinent Vitals/Pain Pain Assessment: Faces Faces Pain Scale: Hurts even more Pain Location: right knee Pain Descriptors / Indicators: Pressure;Tiring;Tightness;Sore Pain Intervention(s): Limited activity within patient's tolerance;Monitored during session           PT Goals (current goals can now be found in the care plan section) Acute Rehab PT Goals Patient Stated Goal: return home and work towards  losing weight PT Goal Formulation: With patient Time For Goal Achievement: 09/16/18 Potential to Achieve Goals: Good    Frequency    7X/week      PT Plan Current plan remains appropriate    Co-evaluation              AM-PAC PT "6 Clicks" Daily Activity  Outcome Measure   Difficulty turning over in bed (including adjusting bedclothes, sheets and blankets)?: A Little Difficulty moving from lying on back to sitting on the side of the bed? : A Lot Difficulty sitting down on and standing up from a chair with arms (e.g., wheelchair, bedside commode, etc,.)?: A Lot Help needed moving to and from a bed to chair (including a wheelchair)?: A Little Help needed walking in hospital room?: A Little Help needed climbing 3-5 steps with a railing? : A Lot 6 Click Score: 15    End of Session Equipment Utilized During Treatment: Gait belt Activity Tolerance: Patient tolerated treatment well Patient left: in bed;with call bell/phone within reach Nurse Communication: Mobility status PT Visit Diagnosis: Other abnormalities of gait and mobility (R26.89);Muscle weakness (generalized) (M62.81);Difficulty in walking, not elsewhere classified (R26.2);Pain Pain - Right/Left: Right Pain - part of body: Knee     Time: [id number] PT Time Calculation (min) (ACUTE ONLY): 20 min  Charges:  $Gait Training: 8-22 mins                     Rinaldo Cloudourtney Sahas Sluka, SPT Acute Rehabilitation Services Office [phone]    Rinaldo CloudCourtney Caera Enwright 09/04/2018, 9:56 AM

## 2018-09-04 NOTE — Plan of Care (Signed)

## 2018-09-04 NOTE — Progress Notes (Signed)
Subjective: 3 Days Post-Op Procedure(s) (LRB): RIGHT TOTAL KNEE ARTHROPLASTY (Right) Patient reports pain as mild.  Doing very well  Objective: Vital signs in last 24 hours: Temp:  [97.9 F (36.6 C)-99.7 F (37.6 C)] 98.6 F (37 C) (11/21 0418) Pulse Rate:  [89-111] 111 (11/21 0418) Resp:  [16-19] 16 (11/21 0418) BP: (128-138)/(76-96) 133/76 (11/21 0418) SpO2:  [94 %-100 %] 100 % (11/21 0418)  Intake/Output from previous day: No intake/output data recorded. Intake/Output this shift: No intake/output data recorded.  Recent Labs    09/02/18 0131  HGB 13.2   Recent Labs    09/02/18 0131  WBC 10.1  RBC 4.66  HCT 43.9  PLT 236   Recent Labs    09/02/18 0131  NA 136  K 4.7  CL 105  CO2 25  BUN 17  CREATININE 1.36*  GLUCOSE 128*  CALCIUM 8.3*   No results for input(s): LABPT, INR in the last 72 hours.  Neurologically intact Neurovascular intact Sensation intact distally Intact pulses distally Dorsiflexion/Plantar flexion intact Incision: dressing C/D/I No cellulitis present Compartment soft  Anticipated LOS equal to or greater than 2 midnights due to - Age 56 and older with one or more of the following:  - Obesity  - Expected need for hospital services (PT, OT, Nursing) required for safe  discharge  - Anticipated need for postoperative skilled nursing care or inpatient rehab  - Active co-morbidities: None OR   - Unanticipated findings during/Post Surgery: Slow post-op progression: GI, pain control, mobility  - Patient is a high risk of re-admission due to: None   Assessment/Plan: 3 Days Post-Op Procedure(s) (LRB): RIGHT TOTAL KNEE ARTHROPLASTY (Right) Advance diet Up with PT.  WBAT RLE D/C home after first or second session of PT (up to patient) Patient politely declined hhpt and would like to start oppt instead.  Ok with us and we will fax rx to [facility] [address]    Cristie HemMary L Eason Housman 09/04/2018, 7:57 AM

## 2018-09-04 NOTE — Telephone Encounter (Signed)
FAXED PT SCRIPT TO [facility] PER PATIENTS REQUEST-OKAY PER LINDSEY PA-C

## 2018-09-04 NOTE — Discharge Summary (Signed)
Patient ID: Ian Wilson MRN: [id number] DOB/AGE: 1961-01-13 57 y.o.  Admit date: 09/01/2018 Discharge date: 09/04/2018  Admission Diagnoses:  Active Problems:   Primary osteoarthritis of right knee   Total knee replacement status   Discharge Diagnoses:  Same  Past Medical History:  Diagnosis Date  . Arthritis    primary localized osteoarthritis B/L knees  . Diverticulosis   . Gout   . Headache   . HTN (hypertension)    h/o- on medication 20 years ago  . Obesity   . Wears glasses     Surgeries: Procedure(s): RIGHT TOTAL KNEE ARTHROPLASTY on 09/01/2018   Consultants:   Discharged Condition: Improved  Hospital Course: Ian Wilson is an 57 y.o. male who was admitted 09/01/2018 for operative treatment of primary localized osteoarthritis right knee. Patient has severe unremitting pain that affects sleep, daily activities, and work/hobbies. After pre-op clearance the patient was taken to the operating room on 09/01/2018 and underwent  Procedure(s): RIGHT TOTAL KNEE ARTHROPLASTY.    Patient was given perioperative antibiotics:  Anti-infectives (From admission, onward)   Start     Dose/Rate Route Frequency Ordered Stop   09/01/18 2000  sulfamethoxazole-trimethoprim (BACTRIM DS,SEPTRA DS) 800-160 MG per tablet 1 tablet     1 tablet Oral Every 12 hours 09/01/18 1501     09/01/18 1530  ceFAZolin (ANCEF) IVPB 2g/100 mL premix     2 g 200 mL/hr over 30 Minutes Intravenous Every 6 hours 09/01/18 1501 09/02/18 0929   09/01/18 1040  vancomycin (VANCOCIN) powder  Status:  Discontinued       As needed 09/01/18 1043 09/01/18 1238   09/01/18 0830  ceFAZolin (ANCEF) 3 g in dextrose 5 % 50 mL IVPB     3 g 100 mL/hr over 30 Minutes Intravenous To [facility]Stay [facility] 08/30/18 1521 09/01/18 0947   09/01/18 0000  sulfamethoxazole-trimethoprim (BACTRIM DS,SEPTRA DS) 800-160 MG tablet     1 tablet Oral 2 times daily 09/01/18 4098         Patient was given sequential  compression devices, early ambulation, and chemoprophylaxis to prevent DVT.  Patient benefited maximally from hospital stay and there were no complications.    Recent vital signs:  Patient Vitals for the past 24 hrs:  BP Temp Temp src Pulse Resp SpO2  09/04/18 0418 133/76 98.6 F (37 C) Oral (!) 111 16 100 %  09/03/18 1955 (!) 138/96 99.7 F (37.6 C) Oral 98 16 94 %  09/03/18 1250 128/87 - - 89 19 95 %  09/03/18 1249 - 97.9 F (36.6 C) Axillary - - -     Recent laboratory studies:  Recent Labs    09/02/18 0131  WBC 10.1  HGB 13.2  HCT 43.9  PLT 236  NA 136  K 4.7  CL 105  CO2 25  BUN 17  CREATININE 1.36*  GLUCOSE 128*  CALCIUM 8.3*     Discharge Medications:   Allergies as of 09/04/2018      Reactions   Bee Venom Anaphylaxis   Peanut Oil Anaphylaxis   Shellfish-derived Products Anaphylaxis      Medication List    STOP taking these medications   ibuprofen 800 MG tablet Commonly known as:  ADVIL,MOTRIN     TAKE these medications   allopurinol 300 MG tablet Commonly known as:  ZYLOPRIM Take 1 tablet (300 mg total) daily by mouth.   aspirin EC 81 MG tablet Take 1 tablet (81 mg total) by mouth 2 (two) times daily.  methocarbamol 750 MG tablet Commonly known as:  ROBAXIN Take 1 tablet (750 mg total) by mouth 2 (two) times daily as needed for muscle spasms.   ondansetron 4 MG tablet Commonly known as:  ZOFRAN Take 1-2 tablets (4-8 mg total) by mouth every 8 (eight) hours as needed for nausea or vomiting.   oxyCODONE 10 mg 12 hr tablet Commonly known as:  OXYCONTIN Take 1 tablet (10 mg total) by mouth every 12 (twelve) hours for 3 days.   oxyCODONE 5 MG immediate release tablet Commonly known as:  Oxy IR/ROXICODONE Take 1-3 tablets (5-15 mg total) by mouth every 4 (four) hours as needed.   promethazine 25 MG tablet Commonly known as:  PHENERGAN Take 1 tablet (25 mg total) by mouth every 6 (six) hours as needed for nausea.   senna-docusate 8.6-50  MG tablet Commonly known as:  Senokot-S Take 1 tablet by mouth at bedtime as needed.   sulfamethoxazole-trimethoprim 800-160 MG tablet Commonly known as:  BACTRIM DS,SEPTRA DS Take 1 tablet by mouth 2 (two) times daily.            Durable Medical Equipment  (From admission, onward)         Start     Ordered   09/01/18 1502  DME Walker rolling  Once    Question:  Patient needs a walker to treat with the following condition  Answer:  Total knee replacement status   09/01/18 1501   09/01/18 1502  DME 3 n 1  Once     09/01/18 1501   09/01/18 1502  DME Bedside commode  Once    Question:  Patient needs a bedside commode to treat with the following condition  Answer:  Total knee replacement status   09/01/18 1501          Diagnostic Studies: Dg Knee Right Port  Result Date: 09/01/2018 CLINICAL DATA:  Status post right total knee joint prosthesis placement. EXAM: PORTABLE RIGHT KNEE - 1-2 VIEW COMPARISON:  Preoperative study of July 23, 2016 FINDINGS: The patient has undergone left total knee joint prosthesis placement. Radiographic positioning of the prosthetic components is good. The interface with the native bone appears normal. There is air in fluid in the anterior aspect of the joint space. IMPRESSION: No immediate complication following right total knee joint prosthesis placement. Electronically Signed   By: David  SwazilandJordan M.D.   On: 09/01/2018 14:18    Disposition: Discharge disposition: 01-Home or Self Care         Follow-up Information    Tarry KosXu, Naiping M, MD In 2 weeks.   Specialty:  Orthopedic Surgery Why:  For suture removal, For wound re-check Contact information: [address] [place] [address] [phone]        Home, [facility] Follow up.   Specialty:  Home Health Services Why:  A representative from [facility] will contact you to arrange start date and time for your therapy. Contact information: [address] [place]  [address] [place] [address] [phone]            Signed: Cristie HemMary L Stanbery 09/04/2018, 7:59 AM

## 2018-09-04 NOTE — Progress Notes (Signed)
Discharge instructions, RX's and follow up appts explained and provided to patient verbalized understanding. Patient left floor via wheelchair accompanied by staff no c/o pain or shortness of breath at discharge.  Tegan Burnside Lynn, RN  

## 2018-09-05 ENCOUNTER — Telehealth (INDEPENDENT_AMBULATORY_CARE_PROVIDER_SITE_OTHER): Payer: Self-pay | Admitting: Orthopaedic Surgery

## 2018-09-05 NOTE — Telephone Encounter (Signed)
Patient's wife left a message at 5:00 tonight stating that her husband was released from the hospital yesterday and is now running a fever of 101.8.  She would like to speak to someone to see what she needs to do.  CB#[phone].  Thank you.

## 2018-09-07 NOTE — Telephone Encounter (Signed)
I called and spoke with them.  He's doing much better now.

## 2018-09-09 DIAGNOSIS — Z471 Aftercare following joint replacement surgery: Secondary | ICD-10-CM | POA: Diagnosis not present

## 2018-09-09 DIAGNOSIS — M25561 Pain in right knee: Secondary | ICD-10-CM | POA: Diagnosis not present

## 2018-09-16 ENCOUNTER — Encounter (INDEPENDENT_AMBULATORY_CARE_PROVIDER_SITE_OTHER): Payer: Self-pay | Admitting: Orthopaedic Surgery

## 2018-09-16 ENCOUNTER — Ambulatory Visit (INDEPENDENT_AMBULATORY_CARE_PROVIDER_SITE_OTHER): Payer: Commercial Managed Care - PPO | Admitting: Physician Assistant

## 2018-09-16 DIAGNOSIS — Z96651 Presence of right artificial knee joint: Secondary | ICD-10-CM

## 2018-09-16 DIAGNOSIS — Z471 Aftercare following joint replacement surgery: Secondary | ICD-10-CM | POA: Diagnosis not present

## 2018-09-16 DIAGNOSIS — M25561 Pain in right knee: Secondary | ICD-10-CM | POA: Diagnosis not present

## 2018-09-16 MED ORDER — COLCHICINE 0.6 MG PO TABS: ORAL_TABLET | ORAL | 0 refills | Status: AC

## 2018-09-16 MED ORDER — METHYLPREDNISOLONE 4 MG PO TBPK: ORAL_TABLET | ORAL | 0 refills | Status: DC

## 2018-09-16 NOTE — Progress Notes (Signed)
Post-Op Visit Note   Patient: Ian SiresGeorge D Schreckengost           Date of Birth: 10/15/1961           MRN: [id number] Visit Date: 09/16/2018 PCP: Wanda PlumpPaz, Jose E, MD   Assessment & Plan:  Chief Complaint:  Chief Complaint  Patient presents with  . Right Knee - Routine Post Op   Visit Diagnoses:  1. S/P TKR (total knee replacement), right     Plan: Patient is a pleasant 57 year old gentleman who presents to our clinic today 15 days status post right total knee replacement, date of surgery 09/01/2018.  He has been doing fairly well since surgery.  He has been outpatient physical therapy making great progress.  He does note that he has been fairly nauseous since surgery.  No vomiting.  Decreased appetite.  He does note occasional itching as well.  He is taking the same medications as he took from his left total knee replacement which did not cause side effects.  He does note 2 days of a fever of 101.8.  This has since resolved.  He denies any constipation.  He does note a gouty flareup of the right ankle.  He states that he had some leftover colchicine from a previous gouty attack which she has started to take.  This seems to have helped a little.  Lamination of the right knee shows a well-healing surgical incision without evidence of infection.  Calf is soft and nontender.  Range of motion 3 to 100 degrees.  He is neurovascularly intact distally.  At this point, I will call in a steroid taper and colchicine for his acute gouty flareup.  In regards to the right knee, he will continue with formal physical therapy and follow-up with us in 4 weeks time for repeat evaluation and x-ray.  He will also keep a journal of his medications to try and figure out what is causing his nausea and itching.  Call with concerns or questions in the meantime.  Follow-Up Instructions: Return in about 4 weeks (around 10/14/2018).   Orders:  No orders of the defined types were placed in this encounter.  Meds ordered this  encounter  Medications  . methylPREDNISolone (MEDROL DOSEPAK) 4 MG TBPK tablet    Sig: Take as directed    Dispense:  21 tablet    Refill:  0  . colchicine 0.6 MG tablet    Sig: Take on pill po bid until symptoms resolve    Dispense:  20 tablet    Refill:  0    Imaging: No new imaging  PMFS History: Patient Active Problem List   Diagnosis Date Noted  . Primary osteoarthritis of right knee 09/01/2018  . S/P TKR (total knee replacement), right 09/01/2018  . Morbid obesity (HCC) 05/13/2018  . Body mass index 40.0-44.9, adult (HCC) 05/13/2018  . Presence of left artificial knee joint 02/10/2018  . S/P TKR (total knee replacement), left 11/21/2017  . PCP NOTES >>>>>>>>>>>>>>>>> 06/19/2016  . Diverticulitis of intestine without perforation or abscess without bleeding 08/02/2015  . Ankle pain 12/16/2014  . Diarrhea, Intermittent, lactose intolerance ? 04/13/2014  . Annual physical exam 12/25/2013  . DJD (degenerative joint disease) 11/18/2013  . Gout 10/29/2012  . HTN (hypertension)    Past Medical History:  Diagnosis Date  . Arthritis    primary localized osteoarthritis B/L knees  . Diverticulosis   . Gout   . Headache   . HTN (hypertension)    h/o-  on medication 20 years ago  . Obesity   . Wears glasses     Family History  Problem Relation Age of Onset  . CAD Mother        CABG in her 33s  . Stroke Mother   . Diabetes Mother   . Prostate cancer Father 21  . Colon cancer Neg Hx     Past Surgical History:  Procedure Laterality Date  . COLONOSCOPY    . TOTAL KNEE ARTHROPLASTY Left 11/21/2017   Procedure: LEFT TOTAL KNEE ARTHROPLASTY;  Surgeon: Tarry Kos, MD;  Location: [facility] OR;  Service: Orthopedics;  Laterality: Left;  . TOTAL KNEE ARTHROPLASTY Right 09/01/2018   Procedure: RIGHT TOTAL KNEE ARTHROPLASTY;  Surgeon: Tarry Kos, MD;  Location: [facility] OR;  Service: Orthopedics;  Laterality: Right;   Social History   Occupational History  . Occupation: Airline pilot      Tobacco Use  . Smoking status: Never Smoker  . Smokeless tobacco: Never Used  Substance and Sexual Activity  . Alcohol use: No  . Drug use: No  . Sexual activity: Not on file

## 2018-09-18 DIAGNOSIS — Z471 Aftercare following joint replacement surgery: Secondary | ICD-10-CM | POA: Diagnosis not present

## 2018-09-18 DIAGNOSIS — M25561 Pain in right knee: Secondary | ICD-10-CM | POA: Diagnosis not present

## 2018-09-19 DIAGNOSIS — H524 Presbyopia: Secondary | ICD-10-CM | POA: Diagnosis not present

## 2018-09-19 DIAGNOSIS — H5213 Myopia, bilateral: Secondary | ICD-10-CM | POA: Diagnosis not present

## 2018-09-23 DIAGNOSIS — M25561 Pain in right knee: Secondary | ICD-10-CM | POA: Diagnosis not present

## 2018-09-23 DIAGNOSIS — Z471 Aftercare following joint replacement surgery: Secondary | ICD-10-CM | POA: Diagnosis not present

## 2018-09-25 DIAGNOSIS — Z471 Aftercare following joint replacement surgery: Secondary | ICD-10-CM | POA: Diagnosis not present

## 2018-09-25 DIAGNOSIS — M25561 Pain in right knee: Secondary | ICD-10-CM | POA: Diagnosis not present

## 2018-09-30 DIAGNOSIS — M25561 Pain in right knee: Secondary | ICD-10-CM | POA: Diagnosis not present

## 2018-09-30 DIAGNOSIS — Z471 Aftercare following joint replacement surgery: Secondary | ICD-10-CM | POA: Diagnosis not present

## 2018-10-02 DIAGNOSIS — M25561 Pain in right knee: Secondary | ICD-10-CM | POA: Diagnosis not present

## 2018-10-02 DIAGNOSIS — Z471 Aftercare following joint replacement surgery: Secondary | ICD-10-CM | POA: Diagnosis not present

## 2018-10-07 DIAGNOSIS — M25561 Pain in right knee: Secondary | ICD-10-CM | POA: Diagnosis not present

## 2018-10-07 DIAGNOSIS — Z471 Aftercare following joint replacement surgery: Secondary | ICD-10-CM | POA: Diagnosis not present

## 2018-10-10 ENCOUNTER — Ambulatory Visit (INDEPENDENT_AMBULATORY_CARE_PROVIDER_SITE_OTHER): Payer: Commercial Managed Care - PPO | Admitting: Orthopaedic Surgery

## 2018-10-10 ENCOUNTER — Ambulatory Visit (INDEPENDENT_AMBULATORY_CARE_PROVIDER_SITE_OTHER): Payer: Self-pay

## 2018-10-10 DIAGNOSIS — Z471 Aftercare following joint replacement surgery: Secondary | ICD-10-CM | POA: Diagnosis not present

## 2018-10-10 DIAGNOSIS — Z96651 Presence of right artificial knee joint: Secondary | ICD-10-CM | POA: Diagnosis not present

## 2018-10-10 DIAGNOSIS — M25561 Pain in right knee: Secondary | ICD-10-CM | POA: Diagnosis not present

## 2018-10-10 MED ORDER — IBUPROFEN 800 MG PO TABS: 800.0000 mg | ORAL_TABLET | Freq: Three times a day (TID) | ORAL | 2 refills | Status: DC | PRN

## 2018-10-10 MED ORDER — OXYCODONE-ACETAMINOPHEN 5-325 MG PO TABS: 1.0000 | ORAL_TABLET | Freq: Every day | ORAL | 0 refills | Status: DC | PRN

## 2018-10-10 NOTE — Progress Notes (Signed)
Post-Op Visit Note   Patient: Ian SiresGeorge D Paskett           Date of Birth: 12/27/1960           MRN: [id number] Visit Date: 10/10/2018 PCP: Wanda PlumpPaz, Jose E, MD   Assessment & Plan:  Chief Complaint:  Chief Complaint  Patient presents with  . Right Knee - Routine Post Op   Visit Diagnoses:  1. S/P TKR (total knee replacement), right     Plan: 6 week TKA follow up plan  Patient presents for follow up 6 weeks status post right total knee replacement.  His range of motion is 0 to 108 degrees.  He continues to do outpatient physical therapy twice a week.  He is ambulating with a cane due to lack of endurance in his right quadriceps.  The wound is healed and there is no evidence of infection. TED hose may be discontinued. Radiographs reveal a total knee arthroplasty in good position, with no evidence of subsidence, loosening, or complicating features. Patient should refrain from any dental procedures, colonoscopies, etc until 3 months postop.  Reminders were given about signs to be aware of including redness, drainage, increased pain, fevers, calf pain, shortness of breath, or any concern should generate a phone call or a return to see us immediately. Will plan to follow up at 3 months postoperatively for next evaluation.  We refilled his pain medicine and ibuprofen today.  Follow-Up Instructions: Return in about 6 weeks (around 11/21/2018).   Orders:  Orders Placed This Encounter  Procedures  . XR KNEE 3 VIEW RIGHT   Meds ordered this encounter  Medications  . oxyCODONE-acetaminophen (PERCOCET) 5-325 MG tablet    Sig: Take 1-2 tablets by mouth daily as needed for severe pain.    Dispense:  20 tablet    Refill:  0  . ibuprofen (ADVIL,MOTRIN) 800 MG tablet    Sig: Take 1 tablet (800 mg total) by mouth every 8 (eight) hours as needed.    Dispense:  60 tablet    Refill:  2    Imaging: Xr Knee 3 View Right  Result Date: 10/10/2018 Stable right total knee replacement exam  alignment.   PMFS History: Patient Active Problem List   Diagnosis Date Noted  . Primary osteoarthritis of right knee 09/01/2018  . S/P TKR (total knee replacement), right 09/01/2018  . Morbid obesity (HCC) 05/13/2018  . Body mass index 40.0-44.9, adult (HCC) 05/13/2018  . Presence of left artificial knee joint 02/10/2018  . S/P TKR (total knee replacement), left 11/21/2017  . PCP NOTES >>>>>>>>>>>>>>>>> 06/19/2016  . Diverticulitis of intestine without perforation or abscess without bleeding 08/02/2015  . Ankle pain 12/16/2014  . Diarrhea, Intermittent, lactose intolerance ? 04/13/2014  . Annual physical exam 12/25/2013  . DJD (degenerative joint disease) 11/18/2013  . Gout 10/29/2012  . HTN (hypertension)    Past Medical History:  Diagnosis Date  . Arthritis    primary localized osteoarthritis B/L knees  . Diverticulosis   . Gout   . Headache   . HTN (hypertension)    h/o- on medication 20 years ago  . Obesity   . Wears glasses     Family History  Problem Relation Age of Onset  . CAD Mother        CABG in her 3750s  . Stroke Mother   . Diabetes Mother   . Prostate cancer Father [phone]  . Colon cancer Neg Hx     Past Surgical History:  Procedure Laterality Date  . COLONOSCOPY    . TOTAL KNEE ARTHROPLASTY Left 11/21/2017   Procedure: LEFT TOTAL KNEE ARTHROPLASTY;  Surgeon: Tarry KosXu, Keyosha Tiedt M, MD;  Location: [facility];  Service: Orthopedics;  Laterality: Left;  . TOTAL KNEE ARTHROPLASTY Right 09/01/2018   Procedure: RIGHT TOTAL KNEE ARTHROPLASTY;  Surgeon: Tarry KosXu, Carley Strickling M, MD;  Location: [facility];  Service: Orthopedics;  Laterality: Right;   Social History   Occupational History  . Occupation: Airline pilotsales    Tobacco Use  . Smoking status: Never Smoker  . Smokeless tobacco: Never Used  Substance and Sexual Activity  . Alcohol use: No  . Drug use: No  . Sexual activity: Not on file

## 2018-10-14 DIAGNOSIS — M25561 Pain in right knee: Secondary | ICD-10-CM | POA: Diagnosis not present

## 2018-10-14 DIAGNOSIS — Z471 Aftercare following joint replacement surgery: Secondary | ICD-10-CM | POA: Diagnosis not present

## 2018-10-17 DIAGNOSIS — Z471 Aftercare following joint replacement surgery: Secondary | ICD-10-CM | POA: Diagnosis not present

## 2018-10-17 DIAGNOSIS — M25561 Pain in right knee: Secondary | ICD-10-CM | POA: Diagnosis not present

## 2018-10-21 ENCOUNTER — Other Ambulatory Visit: Payer: Self-pay | Admitting: Internal Medicine

## 2018-10-21 DIAGNOSIS — Z471 Aftercare following joint replacement surgery: Secondary | ICD-10-CM | POA: Diagnosis not present

## 2018-10-21 DIAGNOSIS — M25561 Pain in right knee: Secondary | ICD-10-CM | POA: Diagnosis not present

## 2018-10-24 DIAGNOSIS — Z471 Aftercare following joint replacement surgery: Secondary | ICD-10-CM | POA: Diagnosis not present

## 2018-10-24 DIAGNOSIS — M25561 Pain in right knee: Secondary | ICD-10-CM | POA: Diagnosis not present

## 2018-10-29 ENCOUNTER — Telehealth: Payer: Self-pay | Admitting: Internal Medicine

## 2018-10-29 DIAGNOSIS — Z471 Aftercare following joint replacement surgery: Secondary | ICD-10-CM | POA: Diagnosis not present

## 2018-10-29 DIAGNOSIS — M25561 Pain in right knee: Secondary | ICD-10-CM | POA: Diagnosis not present

## 2018-10-29 DIAGNOSIS — Z0279 Encounter for issue of other medical certificate: Secondary | ICD-10-CM

## 2018-10-29 NOTE — Telephone Encounter (Signed)
Pt came in to drop off CPE forms for the foster program. Would like to pick them up ASAP.  CB: [phone]

## 2018-10-30 ENCOUNTER — Other Ambulatory Visit: Payer: Self-pay | Admitting: *Deleted

## 2018-10-30 NOTE — Telephone Encounter (Signed)
Completed as much as possible; forwarded to provider/SLS 01/16

## 2018-10-31 ENCOUNTER — Encounter (INDEPENDENT_AMBULATORY_CARE_PROVIDER_SITE_OTHER): Payer: Self-pay

## 2018-10-31 DIAGNOSIS — M25561 Pain in right knee: Secondary | ICD-10-CM | POA: Diagnosis not present

## 2018-10-31 DIAGNOSIS — Z471 Aftercare following joint replacement surgery: Secondary | ICD-10-CM | POA: Diagnosis not present

## 2018-11-03 NOTE — Telephone Encounter (Signed)
Paperwork completed, LMOM informing that forms are ready for pick up at front desk. Copy of form sent for scanning.

## 2018-11-21 ENCOUNTER — Ambulatory Visit (INDEPENDENT_AMBULATORY_CARE_PROVIDER_SITE_OTHER): Payer: Commercial Managed Care - PPO

## 2018-11-21 ENCOUNTER — Encounter (INDEPENDENT_AMBULATORY_CARE_PROVIDER_SITE_OTHER): Payer: Self-pay | Admitting: Orthopaedic Surgery

## 2018-11-21 ENCOUNTER — Ambulatory Visit (INDEPENDENT_AMBULATORY_CARE_PROVIDER_SITE_OTHER): Payer: Managed Care, Other (non HMO) | Admitting: Orthopaedic Surgery

## 2018-11-21 DIAGNOSIS — Z96643 Presence of artificial hip joint, bilateral: Secondary | ICD-10-CM

## 2018-11-21 DIAGNOSIS — Z96652 Presence of left artificial knee joint: Secondary | ICD-10-CM

## 2018-11-21 DIAGNOSIS — Z96651 Presence of right artificial knee joint: Secondary | ICD-10-CM

## 2018-11-21 NOTE — Progress Notes (Signed)
Office Visit Note   Patient: Ian Wilson           Date of Birth: 03/27/1961           MRN: [id number] Visit Date: 11/21/2018              Requested by: Wanda Plump, MD [address] [place], [place] [address] PCP: Wanda Plump, MD   Assessment & Plan: Visit Diagnoses:  1. S/P TKR (total knee replacement), right   2. S/P TKR (total knee replacement), left     Plan: Impression is stable total knee replacements.  He is 3 months status post the right one in 1 year status post the left one.  He is doing well overall.  I would like to see him back in 3 months for his 83-month follow-up and with 2 view x-rays of the right knee  Follow-Up Instructions: Return in about 3 months (around 02/19/2019).   Orders:  Orders Placed This Encounter  Procedures  . XR Knee 1-2 Views Left   No orders of the defined types were placed in this encounter.     Procedures: No procedures performed   Clinical Data: No additional findings.   Subjective: Chief Complaint  Patient presents with  . Right Knee - Routine Post Op, Follow-up    [name] is 3 months status post right total knee replacement in 1 year status post left total knee replacement.  Overall he is doing well.  He has no complaints in regards to his knees other than just some slight swelling and warmness to the right knee.  His left knee is doing very well.  He is very happy overall.  He has finished outpatient physical therapy at this point.  Denies any constant pain.  He takes an occasional oxycodone.   Review of Systems   Objective: Vital Signs: There were no vitals taken for this visit.  Physical Exam  Ortho Exam Bilateral knee exams show fully healed surgical scars.  He has excellent range of motion.  Stable to varus valgus. Specialty Comments:  No specialty comments available.  Imaging: Xr Knee 1-2 Views Left  Result Date: 11/21/2018 Stable appearing left total knee replacement without evidence of  complication.    PMFS History: Patient Active Problem List   Diagnosis Date Noted  . Primary osteoarthritis of right knee 09/01/2018  . S/P TKR (total knee replacement), right 09/01/2018  . Morbid obesity (HCC) 05/13/2018  . Body mass index 40.0-44.9, adult (HCC) 05/13/2018  . Presence of left artificial knee joint 02/10/2018  . S/P TKR (total knee replacement), left 11/21/2017  . PCP NOTES >>>>>>>>>>>>>>>>> 06/19/2016  . Diverticulitis of intestine without perforation or abscess without bleeding 08/02/2015  . Ankle pain 12/16/2014  . Diarrhea, Intermittent, lactose intolerance ? 04/13/2014  . Annual physical exam 12/25/2013  . DJD (degenerative joint disease) 11/18/2013  . Gout 10/29/2012  . HTN (hypertension)    Past Medical History:  Diagnosis Date  . Arthritis    primary localized osteoarthritis B/L knees  . Diverticulosis   . Gout   . Headache   . HTN (hypertension)    h/o- on medication 20 years ago  . Obesity   . Wears glasses     Family History  Problem Relation Age of Onset  . CAD Mother        CABG in her 22s  . Stroke Mother   . Diabetes Mother   . Prostate cancer Father 70  .  Colon cancer Neg Hx     Past Surgical History:  Procedure Laterality Date  . COLONOSCOPY    . TOTAL KNEE ARTHROPLASTY Left 11/21/2017   Procedure: LEFT TOTAL KNEE ARTHROPLASTY;  Surgeon: Tarry Kos, MD;  Location: [facility];  Service: Orthopedics;  Laterality: Left;  . TOTAL KNEE ARTHROPLASTY Right 09/01/2018   Procedure: RIGHT TOTAL KNEE ARTHROPLASTY;  Surgeon: Tarry Kos, MD;  Location: [facility];  Service: Orthopedics;  Laterality: Right;   Social History   Occupational History  . Occupation: Airline pilot    Tobacco Use  . Smoking status: Never Smoker  . Smokeless tobacco: Never Used  Substance and Sexual Activity  . Alcohol use: No  . Drug use: No  . Sexual activity: Not on file

## 2018-12-26 ENCOUNTER — Ambulatory Visit (INDEPENDENT_AMBULATORY_CARE_PROVIDER_SITE_OTHER): Payer: Managed Care, Other (non HMO)

## 2018-12-26 ENCOUNTER — Encounter (INDEPENDENT_AMBULATORY_CARE_PROVIDER_SITE_OTHER): Payer: Self-pay | Admitting: Physician Assistant

## 2018-12-26 ENCOUNTER — Other Ambulatory Visit: Payer: Self-pay

## 2018-12-26 ENCOUNTER — Ambulatory Visit (INDEPENDENT_AMBULATORY_CARE_PROVIDER_SITE_OTHER): Payer: Managed Care, Other (non HMO) | Admitting: Orthopaedic Surgery

## 2018-12-26 DIAGNOSIS — Z96651 Presence of right artificial knee joint: Secondary | ICD-10-CM | POA: Diagnosis not present

## 2018-12-26 NOTE — Progress Notes (Signed)
Office Visit Note   Patient: Ian Wilson           Date of Birth: December 15, 1960           MRN: [id number] Visit Date: 12/26/2018              Requested by: Wanda Plump, MD [address] [place], [place] [address] PCP: Wanda Plump, MD   Assessment & Plan: Visit Diagnoses:  1. S/P TKR (total knee replacement), right     Plan: Impression is right knee contusion status post fall.  Fortunately, the right knee imaging shows a stable prosthesis without acute fracture or loosening.  Patient will continue to increase activity as tolerated.  We will have him follow-up with [place] in approximately 5 months when he is 9 months out from surgery.  He will call us if he needs to be seen sooner.  Follow-Up Instructions: Return in about 5 months (around 05/28/2019).   Orders:  Orders Placed This Encounter  Procedures  . XR KNEE 3 VIEW RIGHT   No orders of the defined types were placed in this encounter.     Procedures: No procedures performed   Clinical Data: No additional findings.   Subjective: Chief Complaint  Patient presents with  . Right Ankle - Pain  . Right Knee - Pain, Follow-up    HPI patient is a pleasant 58 year old gentleman who presents to our clinic today with concerns about his right knee.  He is approximately 4 months status post right total knee replacement.  Doing excellent until he fell coming down from his front porch approximately 2 weeks ago.  He landed on the anterior aspect of his right knee.  The pain he has had has significantly improved over the past few weeks.  Due to his total joint arthroplasty, he just wants to make sure that he did not knock anything loose.  Review of Systems as detailed in HPI.  All others reviewed and are negative.   Objective: Vital Signs: There were no vitals taken for this visit.  Physical Exam well-developed and well-nourished gentleman in no acute distress.  Alert and oriented x3.  Ortho Exam examination of  his right knee shows a very small effusion.  He does have a well-healed surgical incision with some superficial abrasions which have scabbed over.  No joint line tenderness.  He is stable to valgus varus stress.  Range of motion 0 to 125 degrees.  He is able to fully straight leg raise.  He is neurovascularly intact distally.  Specialty Comments:  No specialty comments available.  Imaging: Xr Knee 3 View Right  Result Date: 12/26/2018 X-rays demonstrate a stable prosthesis without complication.  No fracture.    PMFS History: Patient Active Problem List   Diagnosis Date Noted  . Primary osteoarthritis of right knee 09/01/2018  . S/P TKR (total knee replacement), right 09/01/2018  . Morbid obesity (HCC) 05/13/2018  . Body mass index 40.0-44.9, adult (HCC) 05/13/2018  . Presence of left artificial knee joint 02/10/2018  . S/P TKR (total knee replacement), left 11/21/2017  . PCP NOTES >>>>>>>>>>>>>>>>> 06/19/2016  . Diverticulitis of intestine without perforation or abscess without bleeding 08/02/2015  . Ankle pain 12/16/2014  . Diarrhea, Intermittent, lactose intolerance ? 04/13/2014  . Annual physical exam 12/25/2013  . DJD (degenerative joint disease) 11/18/2013  . Gout 10/29/2012  . HTN (hypertension)    Past Medical History:  Diagnosis Date  . Arthritis    primary  localized osteoarthritis B/L knees  . Diverticulosis   . Gout   . Headache   . HTN (hypertension)    h/o- on medication 20 years ago  . Obesity   . Wears glasses     Family History  Problem Relation Age of Onset  . CAD Mother        CABG in her 72s  . Stroke Mother   . Diabetes Mother   . Prostate cancer Father 75  . Colon cancer Neg Hx     Past Surgical History:  Procedure Laterality Date  . COLONOSCOPY    . TOTAL KNEE ARTHROPLASTY Left 11/21/2017   Procedure: LEFT TOTAL KNEE ARTHROPLASTY;  Surgeon: Tarry Kos, MD;  Location: [facility];  Service: Orthopedics;  Laterality: Left;  . TOTAL KNEE  ARTHROPLASTY Right 09/01/2018   Procedure: RIGHT TOTAL KNEE ARTHROPLASTY;  Surgeon: Tarry Kos, MD;  Location: [facility];  Service: Orthopedics;  Laterality: Right;   Social History   Occupational History  . Occupation: Airline pilot    Tobacco Use  . Smoking status: Never Smoker  . Smokeless tobacco: Never Used  Substance and Sexual Activity  . Alcohol use: No  . Drug use: No  . Sexual activity: Not on file

## 2019-01-14 ENCOUNTER — Other Ambulatory Visit: Payer: Self-pay

## 2019-01-14 ENCOUNTER — Ambulatory Visit: Payer: Self-pay

## 2019-01-14 ENCOUNTER — Ambulatory Visit (INDEPENDENT_AMBULATORY_CARE_PROVIDER_SITE_OTHER): Payer: Managed Care, Other (non HMO) | Admitting: Internal Medicine

## 2019-01-14 DIAGNOSIS — R509 Fever, unspecified: Secondary | ICD-10-CM

## 2019-01-14 NOTE — Telephone Encounter (Signed)
Virtual visit scheduled.  

## 2019-01-14 NOTE — Telephone Encounter (Signed)
Pt called to report 2 days of moderate loose diarrhea. Pt also with a fever as high as 103.5. Pt denies any SOB, difficulty breathing, cough.  Pt has been drinking fluids.No signs of dehydration. Pt stated he had 1 episode of dizziness Monday but none since.  Pt is in self quarantine due to possible exposure to coworker who tested positive for [place] since last Friday.  Care advice given and pt verbalized understanding. Pt's e-mail verified and is correct. Pt needs appt within next 24 hour Routing to [facility].       Reason for Disposition . Fever > 101 F (38.3 C)  Answer Assessment - Initial Assessment Questions 1. DIARRHEA SEVERITY: "How bad is the diarrhea?" "How many extra stools have you had in the past 24 hours than normal?"    - NO DIARRHEA (SCALE 0)   - MILD (SCALE 1-3): Few loose or mushy BMs; increase of 1-3 stools over normal daily number of stools; mild increase in ostomy output.   -  MODERATE (SCALE 4-7): Increase of 4-6 stools daily over normal; moderate increase in ostomy output. * SEVERE (SCALE 8-10; OR 'WORST POSSIBLE'): Increase of 7 or more stools daily over normal; moderate increase in ostomy output; incontinence.     moderate 2. ONSET: "When did the diarrhea begin?"      yesterday 3. BM CONSISTENCY: "How loose or watery is the diarrhea?"      loose 4. VOMITING: "Are you also vomiting?" If so, ask: "How many times in the past 24 hours?"      no 5. ABDOMINAL PAIN: "Are you having any abdominal pain?" If yes: "What does it feel like?" (e.g., crampy, dull, intermittent, constant)      no 6. ABDOMINAL PAIN SEVERITY: If present, ask: "How bad is the pain?"  (e.g., Scale 1-10; mild, moderate, or severe)   - MILD (1-3): doesn't interfere with normal activities, abdomen soft and not tender to touch    - MODERATE (4-7): interferes with normal activities or awakens from sleep, tender to touch    - SEVERE (8-10): excruciating pain, doubled over, unable to do any normal  activities       n/a 7. ORAL INTAKE: If vomiting, "Have you been able to drink liquids?" "How much fluids have you had in the past 24 hours?"     n/a 8. HYDRATION: "Any signs of dehydration?" (e.g., dry mouth [not just dry lips], too weak to stand, dizziness, new weight loss) "When did you last urinate?"     Had 1 episode of dizziness Monday-1pm- lt yellow EXPOSURE: "Have you traveled to a foreign country recently?" "Have you been exposed to anyone with diarrhea?" "Could you have eaten any food that was spoiled?"     No-no-no 10. ANTIBIOTIC USE: "Are you taking antibiotics now or have you taken antibiotics in the past 2 months?"       no 11. OTHER SYMPTOMS: "Do you have any other symptoms?" (e.g., fever, blood in stool)       Fever of 103 at 2:17 taking Tylenol  12. PREGNANCY: "Is there any chance you are pregnant?" "When was your last menstrual period?"       n/a  Protocols used: DIARRHEA-A-AH

## 2019-01-14 NOTE — Progress Notes (Signed)
Subjective:    Patient ID: Ian Wilson, male    DOB: [birth year], 58 y.o.   MRN: [id number]  DOS:  01/14/2019 Type of visit - description: Virtual Visit via Video Note  I connected with@ on 01/15/19 at  3:40 PM EDT by a video enabled telemedicine application and verified that I am speaking with the correct person using two identifiers.   THIS ENCOUNTER IS A VIRTUAL VISIT DUE TO COVID-19 - PATIENT WAS NOT SEEN IN THE OFFICE. PATIENT HAS CONSENTED TO VIRTUAL VISIT / TELEMEDICINE VISIT   Location of patient: home  Location of provider: office  I discussed the limitations of evaluation and management by telemedicine and the availability of in person appointments. The patient expressed understanding and agreed to proceed.  History of Present Illness: The patient developed fever 6 days ago. He has essentially isolated himself at home and stay in bed. He is temperature peak today at 103.5, in previous days it was lower. Yesterday, he also developed abdominal  cramps follow-up by episode of diarrhea and since then his the cramps have decreased.    Review of Systems He had some nausea yesterday, no vomiting. Appetite is decreased but he is drinking plenty fluids Denies cough, no chest pain, no difficulty breathing. No headache or rash No blood in the stools or persistent abdominal pain. No recent trips, a coworker was diagnosed with COVID-19 according to a letter his company sent, however he does not know who he/she is or if he was in contact with that co-worker. Has not taking antibiotics in months.  Past Medical History:  Diagnosis Date  . Arthritis    primary localized osteoarthritis B/L knees  . Diverticulosis   . Gout   . Headache   . HTN (hypertension)    h/o- on medication 20 years ago  . Obesity   . Wears glasses     Past Surgical History:  Procedure Laterality Date  . COLONOSCOPY    . TOTAL KNEE ARTHROPLASTY Left 11/21/2017   Procedure: LEFT TOTAL KNEE  ARTHROPLASTY;  Surgeon: Tarry Kos, MD;  Location: [facility] OR;  Service: Orthopedics;  Laterality: Left;  . TOTAL KNEE ARTHROPLASTY Right 09/01/2018   Procedure: RIGHT TOTAL KNEE ARTHROPLASTY;  Surgeon: Tarry Kos, MD;  Location: [facility] OR;  Service: Orthopedics;  Laterality: Right;    Social History   Socioeconomic History  . Marital status: Married    Spouse name: Not on file  . Number of children: 1  . Years of education: Not on file  . Highest education level: Not on file  Occupational History  . Occupation: Architectural technologist  . Financial resource strain: Not on file  . Food insecurity:    Worry: Not on file    Inability: Not on file  . Transportation needs:    Medical: Not on file    Non-medical: Not on file  Tobacco Use  . Smoking status: Never Smoker  . Smokeless tobacco: Never Used  Substance and Sexual Activity  . Alcohol use: No  . Drug use: No  . Sexual activity: Not on file  Lifestyle  . Physical activity:    Days per week: Not on file    Minutes per session: Not on file  . Stress: Not on file  Relationships  . Social connections:    Talks on phone: Not on file    Gets together: Not on file    Attends religious service: Not on file    Active  member of club or organization: Not on file    Attends meetings of clubs or organizations: Not on file    Relationship status: Not on file  . Intimate partner violence:    Fear of current or ex partner: Not on file    Emotionally abused: Not on file    Physically abused: Not on file    Forced sexual activity: Not on file  Other Topics Concern  . Not on file  Social History Narrative   Lives w/ wife    Daughter 1985      Allergies as of 01/14/2019      Reactions   Bee Venom Anaphylaxis   Peanut Oil Anaphylaxis   Shellfish-derived Products Anaphylaxis      Medication List       Accurate as of January 14, 2019 11:59 PM. Always use your most recent med list.        allopurinol 300 MG tablet Commonly known  as:  ZYLOPRIM Take 1 tablet (300 mg total) by mouth daily.   aspirin EC 81 MG tablet Take 1 tablet (81 mg total) by mouth 2 (two) times daily.   colchicine 0.6 MG tablet Take on pill po bid until symptoms resolve   ondansetron 4 MG tablet Commonly known as:  ZOFRAN Take 1-2 tablets (4-8 mg total) by mouth every 8 (eight) hours as needed for nausea or vomiting.   senna-docusate 8.6-50 MG tablet Commonly known as:  Senokot S Take 1 tablet by mouth at bedtime as needed.           Objective:   Physical Exam There were no vitals taken for this visit. Disease video conference, he looked well, in no distress, alert oriented x3, smiling.    Assessment     Assessment HTN? Gout DJD, knees  H/o diverticulitis 2016 Allergies: peanut oil, shellfish On EpiPen  PLAN: Fever: Fever for 6 days, otherwise he reports he is feeling well except for an episode of abdominal cramping and diarrhea yesterday. He is in no distress. In the midst of COVID-19 pandemia that is certainly in the differential diagnosis.  Other viruses, GI infection, etc. are certainly possible. At this point I recommend him to continue to be isolated suspecting that this is COVID-19, okay to stop isolation 3 days after he stopped having fever without taking Tylenol. Encouraged him to visit the CDC webpage for more detailed information. He is encouraged to drink plenty fluids. Go to the ER if he has persistent fevers for more than 2 to 3 days, or if he develop other symptoms such as cough, chest pain, difficulty breathing, severe diarrhea, blood in the stools etc. The patient verbalized understanding.  20 min    I discussed the assessment and treatment plan with the patient. The patient was provided an opportunity to ask questions and all were answered. The patient agreed with the plan and demonstrated an understanding of the instructions.   The patient was advised to call back or seek an in-person evaluation if the  symptoms worsen or if the condition fails to improve as anticipated.

## 2019-01-15 NOTE — Assessment & Plan Note (Signed)
Fever: Fever for 6 days, otherwise he reports he is feeling well except for an episode of abdominal cramping and diarrhea yesterday. He is in no distress. In the midst of COVID-19 pandemia that is certainly in the differential diagnosis.  Other viruses, GI infection, etc. are certainly possible. At this point I recommend him to continue to be isolated suspecting that this is COVID-19, okay to stop isolation 3 days after he stopped having fever without taking Tylenol. Encouraged him to visit the CDC webpage for more detailed information. He is encouraged to drink plenty fluids. Go to the ER if he has persistent fevers for more than 2 to 3 days, or if he develop other symptoms such as cough, chest pain, difficulty breathing, severe diarrhea, blood in the stools etc. The patient verbalized understanding.

## 2019-02-20 ENCOUNTER — Other Ambulatory Visit: Payer: Self-pay

## 2019-02-20 ENCOUNTER — Ambulatory Visit (INDEPENDENT_AMBULATORY_CARE_PROVIDER_SITE_OTHER): Payer: Commercial Managed Care - PPO | Admitting: Orthopaedic Surgery

## 2019-02-20 ENCOUNTER — Encounter: Payer: Self-pay | Admitting: Orthopaedic Surgery

## 2019-02-20 ENCOUNTER — Ambulatory Visit: Payer: Managed Care, Other (non HMO) | Admitting: Orthopaedic Surgery

## 2019-02-20 DIAGNOSIS — Z96652 Presence of left artificial knee joint: Secondary | ICD-10-CM

## 2019-02-20 DIAGNOSIS — Z96651 Presence of right artificial knee joint: Secondary | ICD-10-CM | POA: Diagnosis not present

## 2019-02-20 IMAGING — CR DG CHEST 2V
2 series · 2 of 2 positions shown · non-contrast
Comparison: None.

CLINICAL DATA: Preoperative for left knee surgery

EXAM:
CHEST  2 VIEW

[w chest pa]
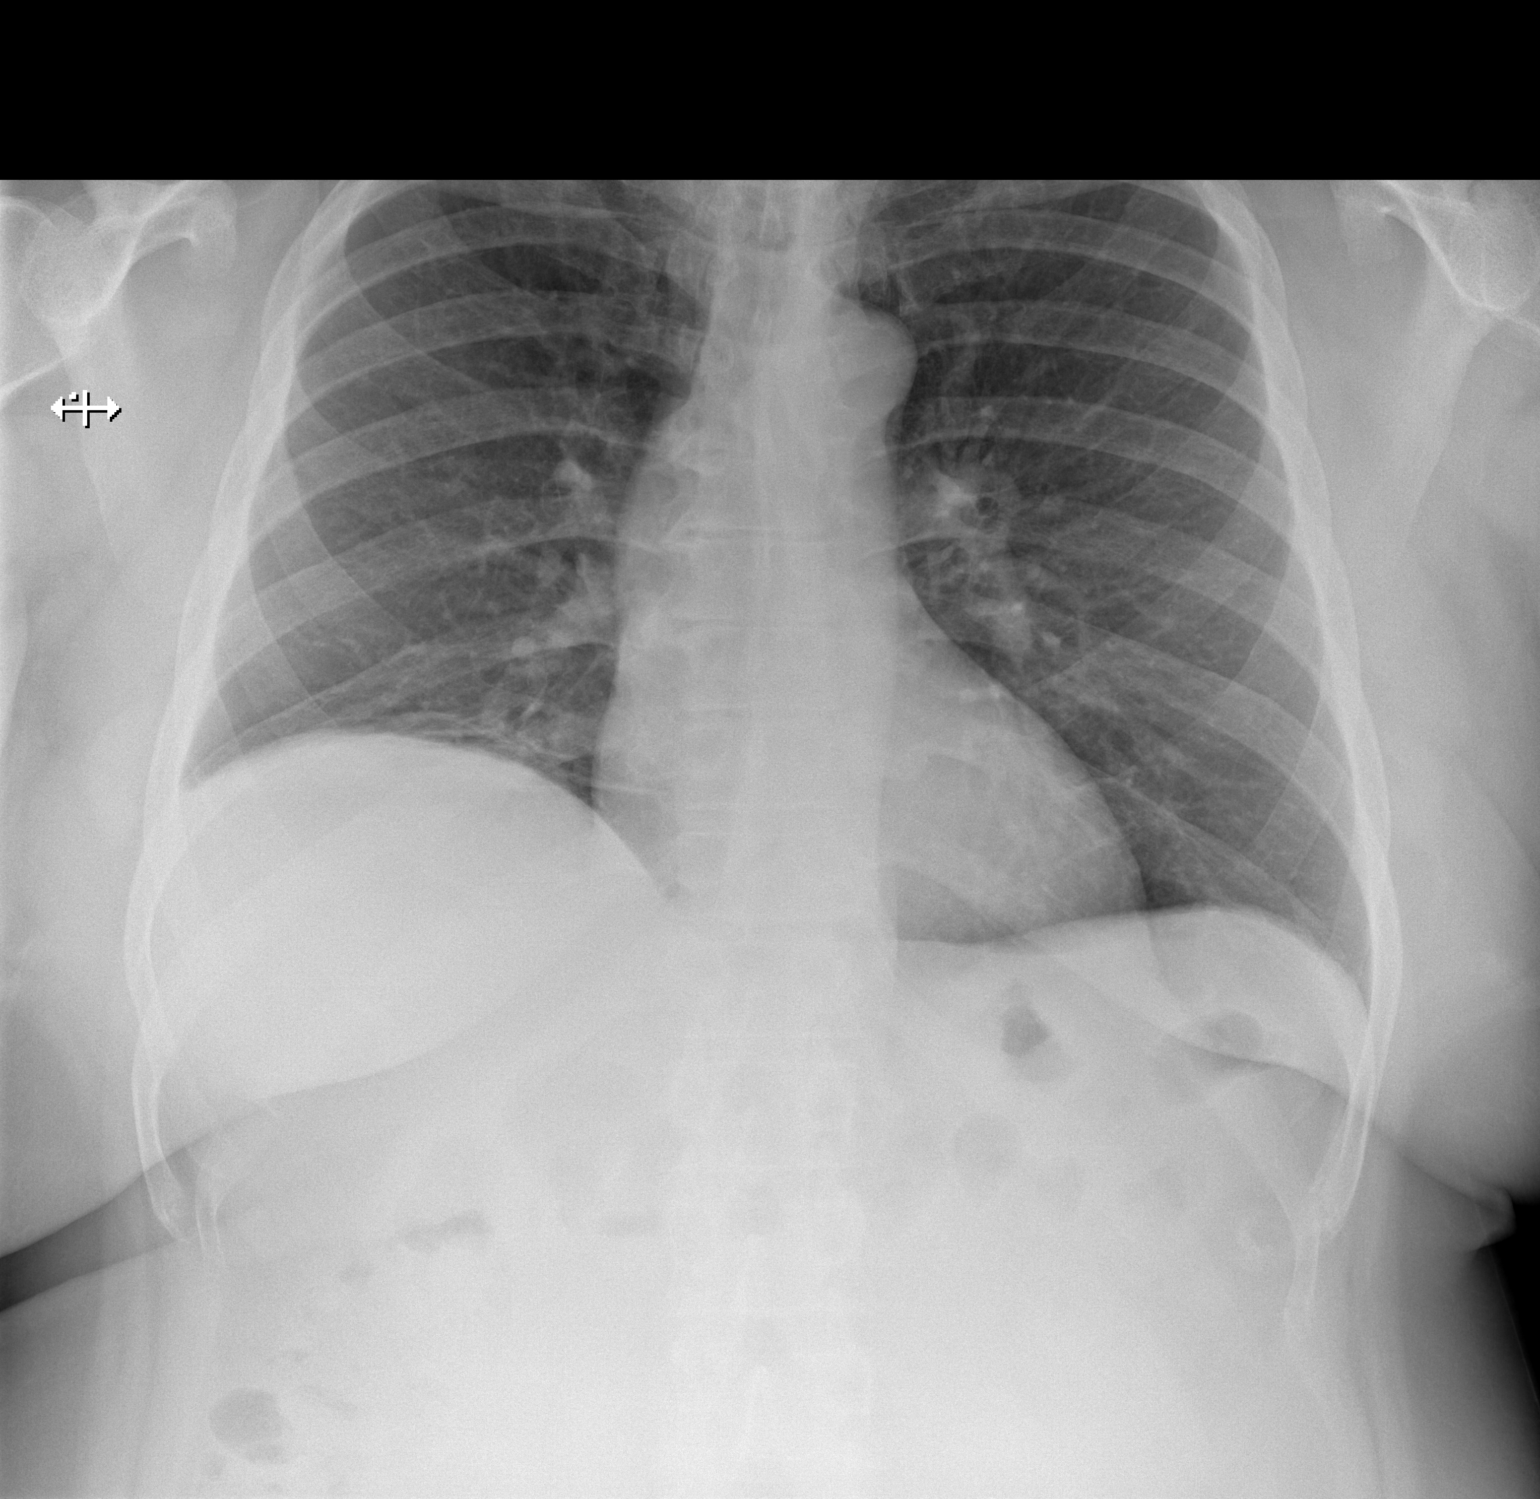

[w chest lat]
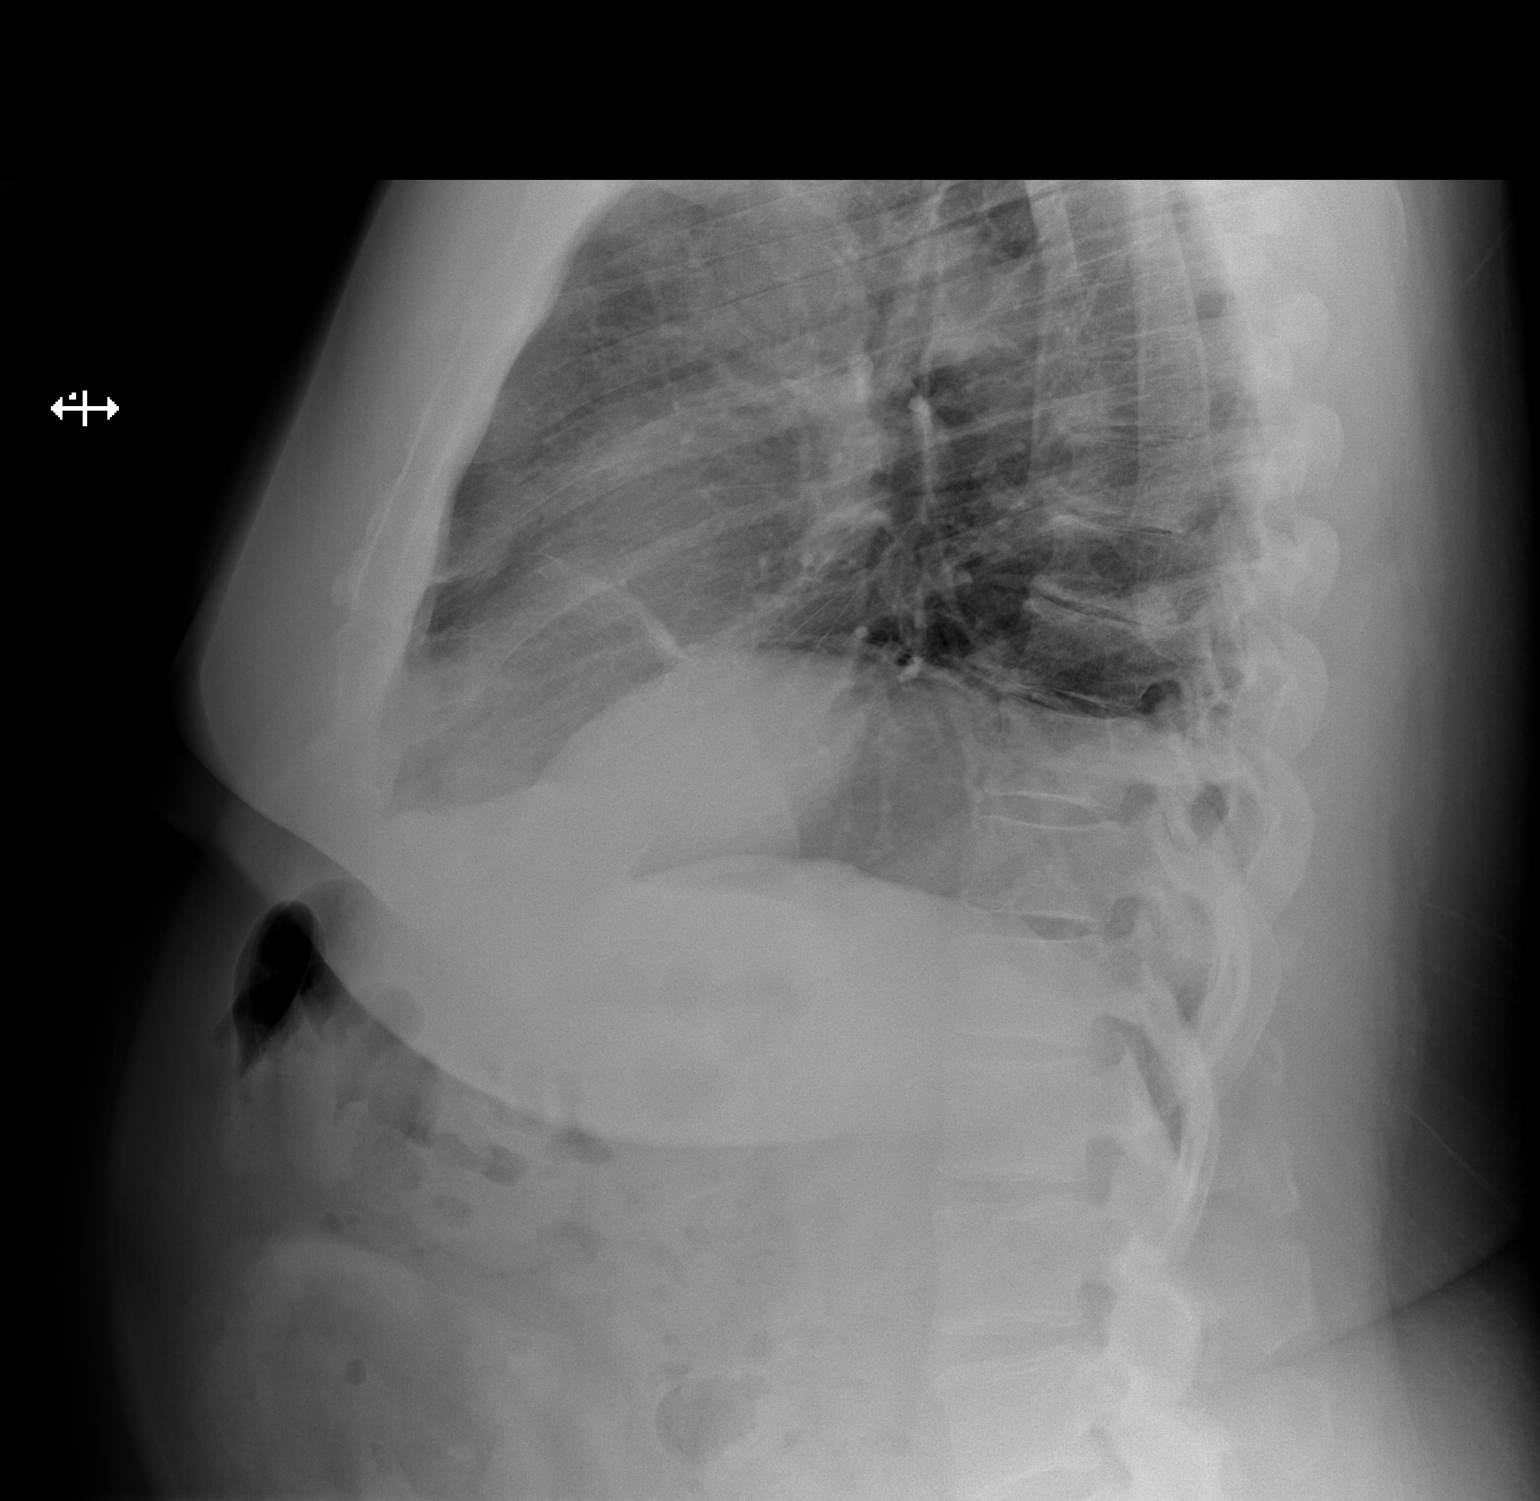

[2 of 2 positions shown; findings below may reference images not displayed]

FINDINGS: Normal heart size. Normal mediastinal contour. No pneumothorax. No
pleural effusion. Mild linear scarring versus atelectasis at the
right lung base. Otherwise clear lungs with no pulmonary edema.
IMPRESSION: Mild scarring versus atelectasis at the anterior right lung base.
Otherwise no active disease in chest.

## 2019-02-20 NOTE — Progress Notes (Signed)
Office Visit Note   Patient: Ian Wilson           Date of Birth: [birth year]           MRN: [id number] Visit Date: 02/20/2019              Requested by: Wanda Plump, MD [id number] [place], [place] [address] PCP: Wanda Plump, MD   Assessment & Plan: Visit Diagnoses:  1. S/P TKR (total knee replacement), right   2. S/P TKR (total knee replacement), left     Plan: Kali is 6 months status post right total knee replacement with a fall last month which likely resulted in a bony contusion and some persistent swelling.  I recommend ice and relative rest for this.  In terms of his left knee he is doing very well and very happy.  I will see him back in 6 months with 2 view x-rays of bilateral knees  Follow-Up Instructions: Return in about 6 months (around 08/23/2019).   Orders:  No orders of the defined types were placed in this encounter.  No orders of the defined types were placed in this encounter.     Procedures: No procedures performed   Clinical Data: No additional findings.   Subjective: Chief Complaint  Patient presents with  . Right Knee - Pain, Follow-up    Ian Wilson is a 38-month status post right total knee replacement and a little over a year status post left total knee replacement.  He comes in today for follow-up.  He is doing well overall.  He takes an occasional ibuprofen.  He did fall directly on his right knee about a month ago for which we checked him out and x-rays were negative.  Overall he is doing well.  He does have a little bit of swelling on the right knee over the anterior aspect of the proximal tibia where he fell.   Review of Systems   Objective: Vital Signs: There were no vitals taken for this visit.  Physical Exam  Ortho Exam Bilateral knee exam shows fully healed surgical scars.  Excellent range of motion.  Collaterals are stable.  He does have a little extra swelling on the right knee which is slightly warm but there  is no evidence of infection.  It is not really tender to palpation. Specialty Comments:  No specialty comments available.  Imaging: No results found.   PMFS History: Patient Active Problem List   Diagnosis Date Noted  . Primary osteoarthritis of right knee 09/01/2018  . S/P TKR (total knee replacement), right 09/01/2018  . Morbid obesity (HCC) 05/13/2018  . Body mass index 40.0-44.9, adult (HCC) 05/13/2018  . Presence of left artificial knee joint 02/10/2018  . S/P TKR (total knee replacement), left 11/21/2017  . PCP NOTES >>>>>>>>>>>>>>>>> 06/19/2016  . Diverticulitis of intestine without perforation or abscess without bleeding 08/02/2015  . Ankle pain 12/16/2014  . Diarrhea, Intermittent, lactose intolerance ? 04/13/2014  . Annual physical exam 12/25/2013  . DJD (degenerative joint disease) 11/18/2013  . Gout 10/29/2012  . HTN (hypertension)    Past Medical History:  Diagnosis Date  . Arthritis    primary localized osteoarthritis B/L knees  . Diverticulosis   . Gout   . Headache   . HTN (hypertension)    h/o- on medication 20 years ago  . Obesity   . Wears glasses     Family History  Problem Relation Age of Onset  .  CAD Mother        CABG in her 1450s  . Stroke Mother   . Diabetes Mother   . Prostate cancer Father [id number]  . Colon cancer Neg Hx     Past Surgical History:  Procedure Laterality Date  . COLONOSCOPY    . TOTAL KNEE ARTHROPLASTY Left 11/21/2017   Procedure: LEFT TOTAL KNEE ARTHROPLASTY;  Surgeon: Tarry KosXu,  M, MD;  Location: [facility] OR;  Service: Orthopedics;  Laterality: Left;  . TOTAL KNEE ARTHROPLASTY Right 09/01/2018   Procedure: RIGHT TOTAL KNEE ARTHROPLASTY;  Surgeon: Tarry KosXu,  M, MD;  Location: [facility] OR;  Service: Orthopedics;  Laterality: Right;   Social History   Occupational History  . Occupation: Airline pilotsales    Tobacco Use  . Smoking status: Never Smoker  . Smokeless tobacco: Never Used  Substance and Sexual Activity  . Alcohol use: No  . Drug use:  No  . Sexual activity: Not on file

## 2019-02-23 ENCOUNTER — Other Ambulatory Visit: Payer: Self-pay

## 2019-02-23 MED ORDER — AMOXICILLIN 500 MG PO TABS: ORAL_TABLET | ORAL | 0 refills | Status: AC

## 2019-02-28 IMAGING — DX DG KNEE 1-2V PORT*L*
1 series · 2 of 2 positions shown · non-contrast
Comparison: 07/23/2016.

CLINICAL DATA: Status post left knee replacement.

EXAM:
PORTABLE LEFT KNEE - 1-2 VIEW

[Series 1: knee · 0.14mm/px · 2 of 2 slices shown]
[im 1/2]
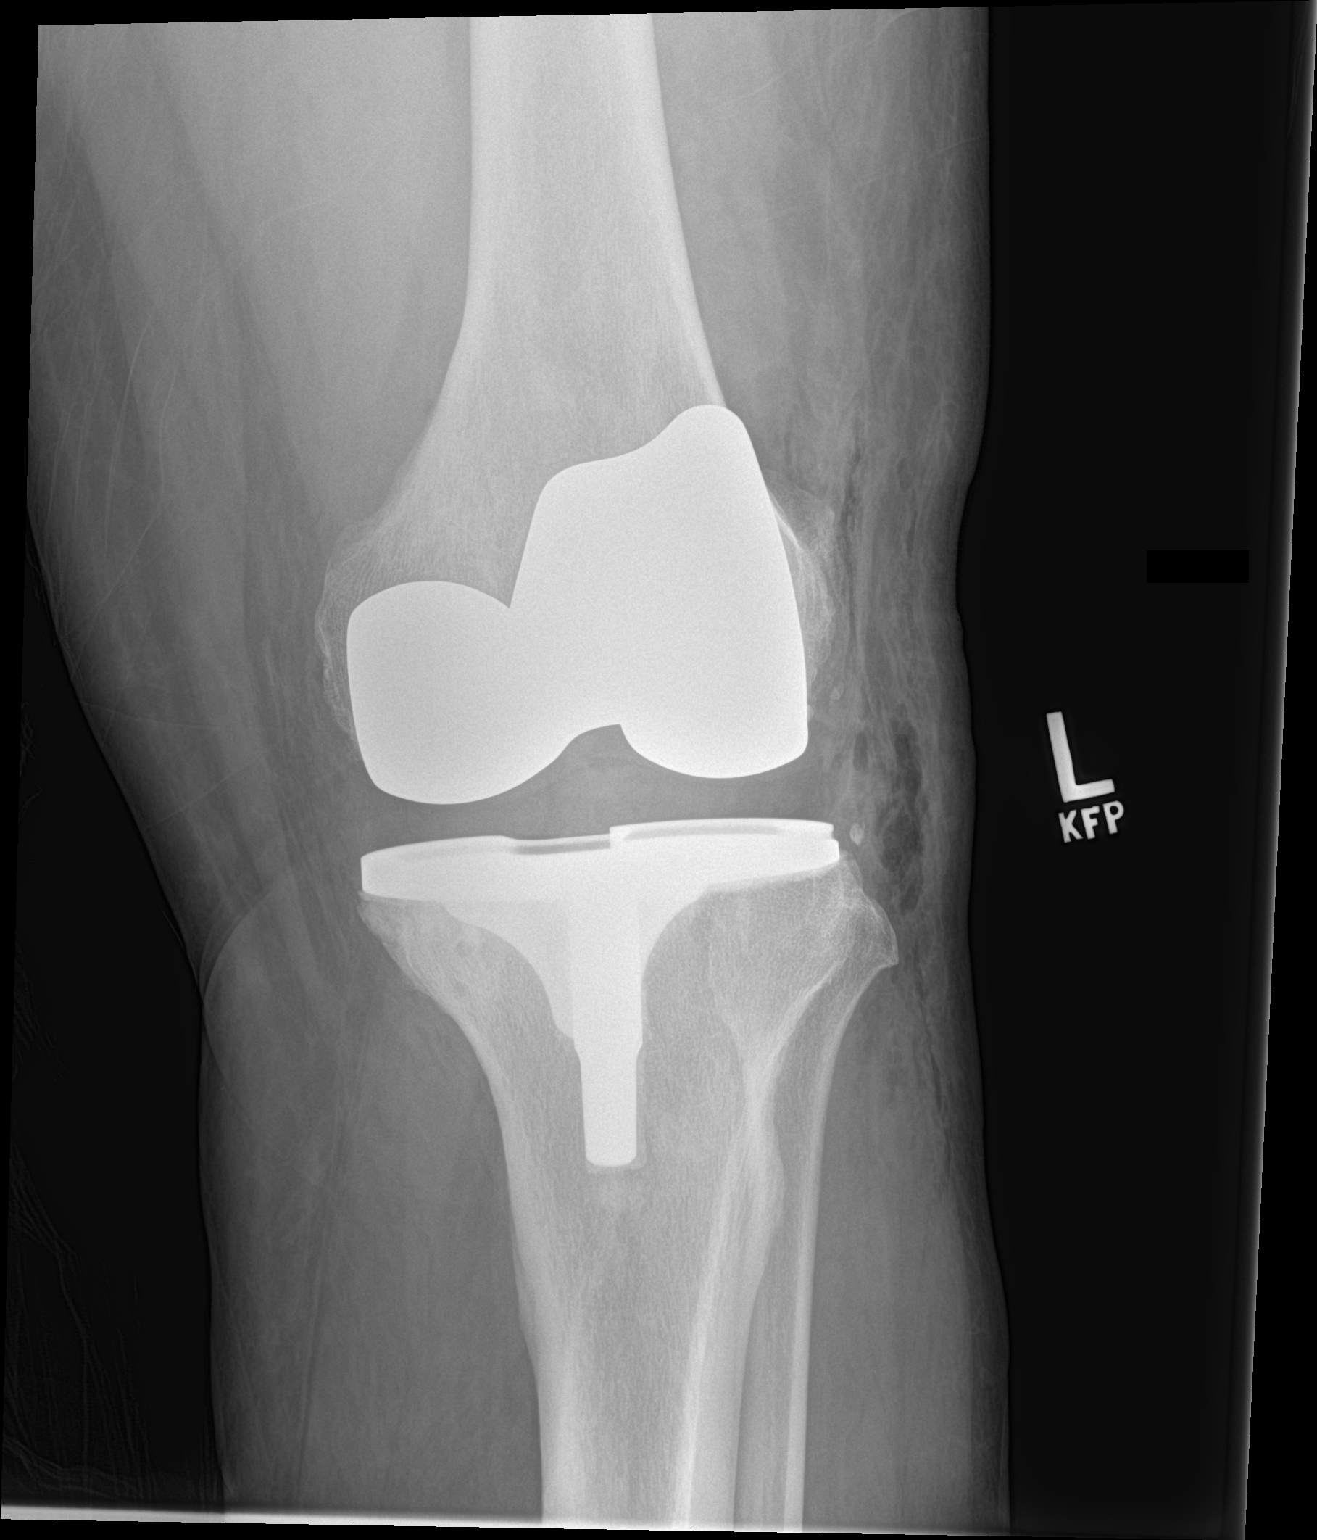
[im 2/2]
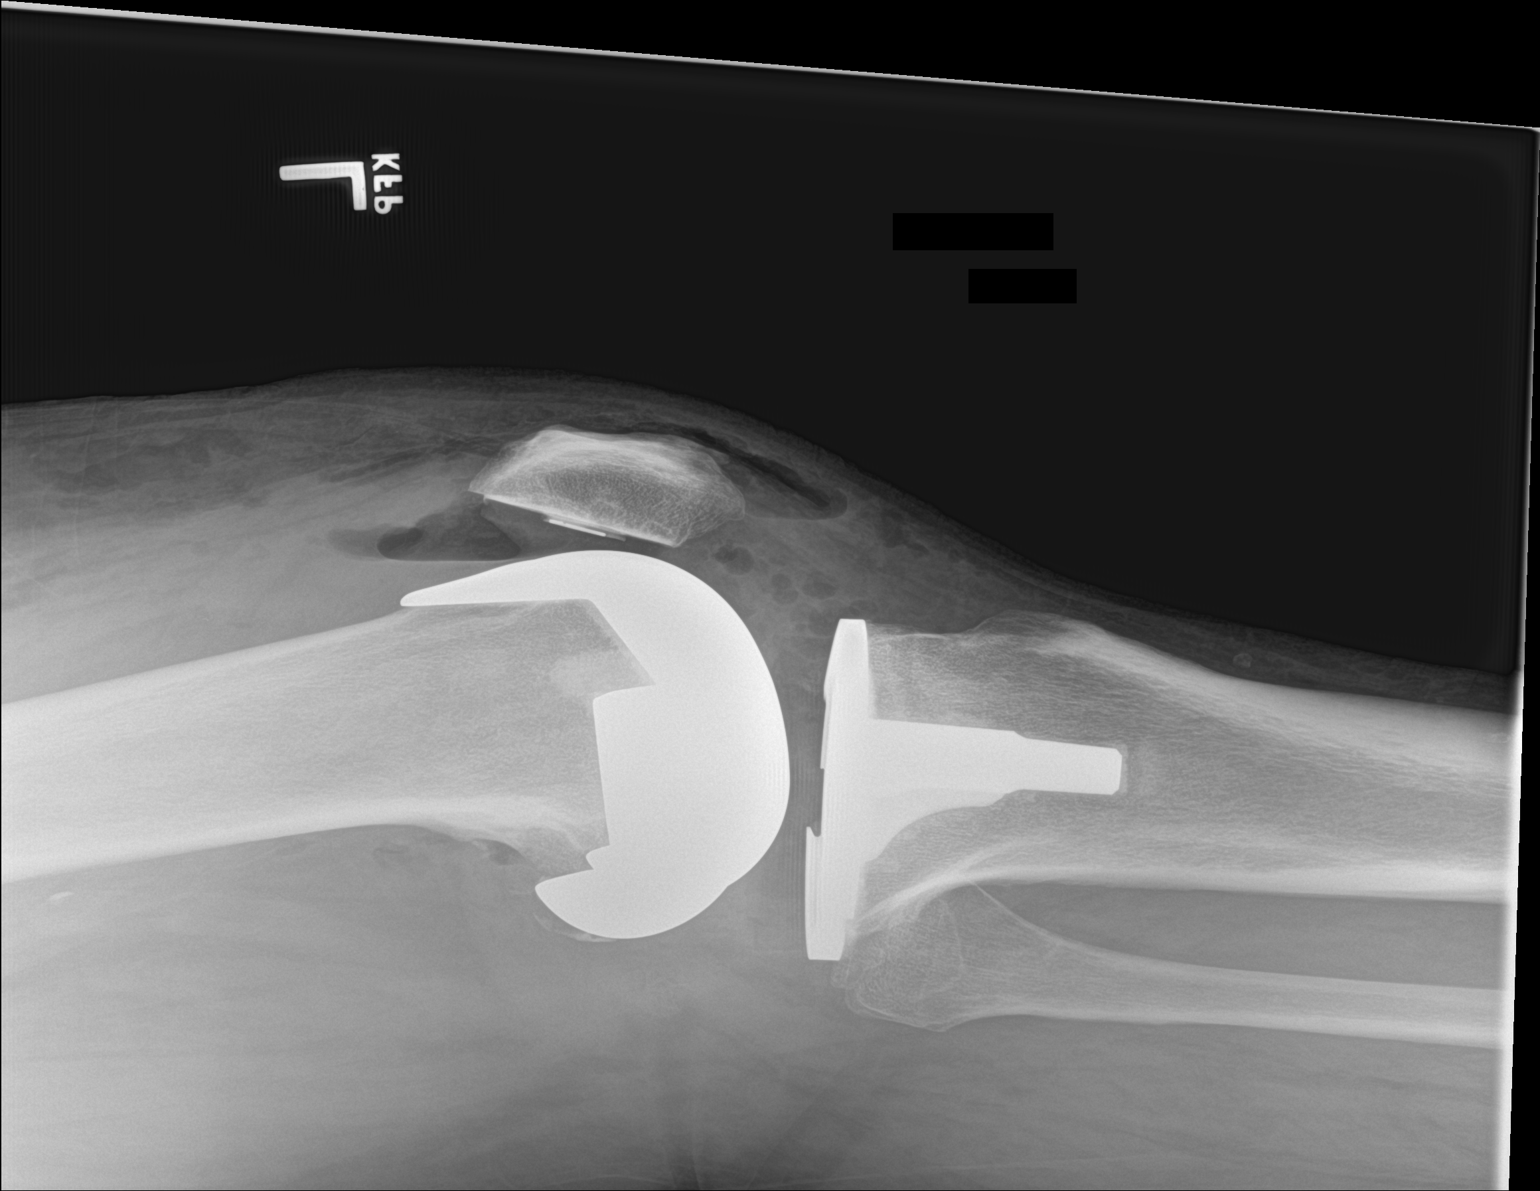

[2 of 2 positions shown; findings below may reference images not displayed]

FINDINGS: Interval left total knee prosthesis in satisfactory position and
alignment. No fracture or dislocation.
IMPRESSION: Satisfactory postoperative appearance of a left total knee
prosthesis.

## 2019-03-02 ENCOUNTER — Other Ambulatory Visit: Payer: Self-pay

## 2019-03-02 MED ORDER — ALLOPURINOL 300 MG PO TABS: 300.0000 mg | ORAL_TABLET | Freq: Every day | ORAL | 1 refills | Status: DC

## 2019-08-21 ENCOUNTER — Ambulatory Visit: Payer: Managed Care, Other (non HMO) | Admitting: Orthopaedic Surgery

## 2019-11-03 ENCOUNTER — Other Ambulatory Visit: Payer: Self-pay | Admitting: Internal Medicine

## 2020-02-02 ENCOUNTER — Other Ambulatory Visit: Payer: Self-pay | Admitting: Internal Medicine

## 2020-03-03 ENCOUNTER — Other Ambulatory Visit: Payer: Self-pay | Admitting: Internal Medicine

## 2022-05-17 ENCOUNTER — Other Ambulatory Visit: Payer: Self-pay

## 2022-05-17 ENCOUNTER — Encounter (HOSPITAL_BASED_OUTPATIENT_CLINIC_OR_DEPARTMENT_OTHER): Payer: Self-pay | Admitting: Emergency Medicine

## 2022-05-17 ENCOUNTER — Emergency Department (HOSPITAL_BASED_OUTPATIENT_CLINIC_OR_DEPARTMENT_OTHER): Payer: BLUE CROSS/BLUE SHIELD

## 2022-05-17 ENCOUNTER — Emergency Department (HOSPITAL_BASED_OUTPATIENT_CLINIC_OR_DEPARTMENT_OTHER)
Admission: EM | Admit: 2022-05-17 | Discharge: 2022-05-17 | Disposition: A | Discharge status: Home / Self Care | Payer: BLUE CROSS/BLUE SHIELD | Attending: Emergency Medicine | Admitting: Emergency Medicine

## 2022-05-17 DIAGNOSIS — I10 Essential (primary) hypertension: Secondary | ICD-10-CM | POA: Insufficient documentation

## 2022-05-17 DIAGNOSIS — Z91010 Allergy to peanuts: Secondary | ICD-10-CM | POA: Insufficient documentation

## 2022-05-17 DIAGNOSIS — R55 Syncope and collapse: Secondary | ICD-10-CM | POA: Diagnosis present

## 2022-05-17 DIAGNOSIS — Z79899 Other long term (current) drug therapy: Secondary | ICD-10-CM | POA: Diagnosis not present

## 2022-05-17 DIAGNOSIS — Z7982 Long term (current) use of aspirin: Secondary | ICD-10-CM | POA: Insufficient documentation

## 2022-05-17 HISTORY — DX: Type 2 diabetes mellitus without complications: E11.9

## 2022-05-17 LAB — CBC WITH DIFFERENTIAL/PLATELET
Abs Immature Granulocytes: 0.02 10*3/uL (ref 0.00–0.07)
Basophils Absolute: 0 10*3/uL (ref 0.0–0.1)
Basophils Relative: 1 %
Eosinophils Absolute: 0.3 10*3/uL (ref 0.0–0.5)
Eosinophils Relative: 4 %
HCT: 44.9 % (ref 39.0–52.0)
Hemoglobin: 14.7 g/dL (ref 13.0–17.0)
Immature Granulocytes: 0 %
Lymphocytes Relative: 21 %
Lymphs Abs: 1.5 10*3/uL (ref 0.7–4.0)
MCH: 30.1 pg (ref 26.0–34.0)
MCHC: 32.7 g/dL (ref 30.0–36.0)
MCV: 92 fL (ref 80.0–100.0)
Monocytes Absolute: 0.6 10*3/uL (ref 0.1–1.0)
Monocytes Relative: 8 %
Neutro Abs: 4.7 10*3/uL (ref 1.7–7.7)
Neutrophils Relative %: 66 %
Platelets: 260 10*3/uL (ref 150–400)
RBC: 4.88 MIL/uL (ref 4.22–5.81)
RDW: 13.2 % (ref 11.5–15.5)
WBC: 7.1 10*3/uL (ref 4.0–10.5)
nRBC: 0 % (ref 0.0–0.2)

## 2022-05-17 LAB — COMPREHENSIVE METABOLIC PANEL
ALT: 19 U/L (ref 0–44)
AST: 15 U/L (ref 15–41)
Albumin: 4.4 g/dL (ref 3.5–5.0)
Alkaline Phosphatase: 64 U/L (ref 38–126)
Anion gap: 10 (ref 5–15)
BUN: 15 mg/dL (ref 6–20)
CO2: 25 mmol/L (ref 22–32)
Calcium: 8.9 mg/dL (ref 8.9–10.3)
Chloride: 107 mmol/L (ref 98–111)
Creatinine, Ser: 1.26 mg/dL — ABNORMAL HIGH (ref 0.61–1.24)
GFR, Estimated: >60 mL/min (ref >60)
Glucose, Bld: 111 mg/dL — ABNORMAL HIGH (ref 70–99)
Potassium: 4 mmol/L (ref 3.5–5.1)
Sodium: 142 mmol/L (ref 135–145)
Total Bilirubin: 0.4 mg/dL (ref 0.3–1.2)
Total Protein: 7.1 g/dL (ref 6.5–8.1)

## 2022-05-17 MED ORDER — SODIUM CHLORIDE 0.9 % IV BOLUS
1000.0000 mL | Freq: Once | INTRAVENOUS | Status: AC
  Administered 2022-05-17: 1000 mL via INTRAVENOUS

## 2022-05-17 NOTE — ED Provider Notes (Signed)
[facility] EMERGENCY DEPT Provider Note   CSN: [id number] Arrival date & time: 05/17/22  1456     History  Chief Complaint  Patient presents with   Hypertension    Ian Wilson is a 61 y.o. male.  The history is provided by the patient.  Near Syncope This is a new problem. The current episode started 3 to 5 hours ago. The problem has been resolved. Pertinent negatives include no chest pain, no abdominal pain, no headaches and no shortness of breath. Nothing aggravates the symptoms. Nothing relieves the symptoms. He has tried nothing for the symptoms. The treatment provided no relief.       Home Medications Prior to Admission medications   Medication Sig Start Date End Date Taking? Authorizing Provider  allopurinol (ZYLOPRIM) 300 MG tablet Take 1 tablet (300 mg total) by mouth daily. 11/03/19   Wanda Plump, MD  amoxicillin (AMOXIL) 500 MG tablet Take 4 tabs po 30 mins before procedure 02/23/19   Cristie Hem, PA-C  aspirin EC 81 MG tablet Take 1 tablet (81 mg total) by mouth 2 (two) times daily. 09/01/18   Tarry Kos, MD  colchicine 0.6 MG tablet Take on pill po bid until symptoms resolve 09/16/18   Cristie Hem, PA-C  ondansetron (ZOFRAN) 4 MG tablet Take 1-2 tablets (4-8 mg total) by mouth every 8 (eight) hours as needed for nausea or vomiting. Patient not taking: Reported on 10/10/2018 09/01/18   Tarry Kos, MD  senna-docusate (SENOKOT S) 8.6-50 MG tablet Take 1 tablet by mouth at bedtime as needed. 09/01/18   Tarry Kos, MD      Allergies    Bee venom, Peanut oil, and Shellfish-derived products    Review of Systems   Review of Systems  Respiratory:  Negative for shortness of breath.   Cardiovascular:  Positive for near-syncope. Negative for chest pain.  Gastrointestinal:  Positive for diarrhea and nausea. Negative for abdominal pain.  Neurological:  Negative for headaches.    Physical Exam Updated Vital Signs  ED Triage Vitals   Enc Vitals Group     BP 05/17/22 1519 (!) 138/91     Pulse Rate 05/17/22 1519 (!) 108     Resp 05/17/22 1526 (!) 21     Temp 05/17/22 1526 98.8 F (37.1 C)     Temp Source 05/17/22 1526 Oral     SpO2 05/17/22 1519 95 %     Weight --      Height --      Head Circumference --      Peak Flow --      Pain Score 05/17/22 1525 0     Pain Loc --      Pain Edu? --      Excl. in GC? --     Physical Exam Vitals and nursing note reviewed.  Constitutional:      General: He is not in acute distress.    Appearance: He is well-developed. He is not ill-appearing.  HENT:     Head: Normocephalic and atraumatic.     Nose: Nose normal.     Mouth/Throat:     Mouth: Mucous membranes are moist.  Eyes:     Extraocular Movements: Extraocular movements intact.     Conjunctiva/sclera: Conjunctivae normal.     Pupils: Pupils are equal, round, and reactive to light.  Cardiovascular:     Rate and Rhythm: Normal rate and regular rhythm.     Pulses: Normal pulses.  Heart sounds: Normal heart sounds. No murmur heard. Pulmonary:     Effort: Pulmonary effort is normal. No respiratory distress.     Breath sounds: Normal breath sounds.  Abdominal:     Palpations: Abdomen is soft.     Tenderness: There is no abdominal tenderness.  Musculoskeletal:        General: No swelling.     Cervical back: Neck supple.  Skin:    General: Skin is warm and dry.     Capillary Refill: Capillary refill takes less than 2 seconds.  Neurological:     General: No focal deficit present.     Mental Status: He is alert and oriented to person, place, and time.     Cranial Nerves: No cranial nerve deficit.     Sensory: No sensory deficit.     Motor: No weakness.     Coordination: Coordination normal.     Gait: Gait normal.     Comments: Normal strength and sensation   Psychiatric:        Mood and Affect: Mood normal.     ED Results / Procedures / Treatments   Labs (all labs ordered are listed, but only  abnormal results are displayed) Labs Reviewed  COMPREHENSIVE METABOLIC PANEL - Abnormal; Notable for the following components:      Result Value   Glucose, Bld 111 (*)    Creatinine, Ser 1.26 (*)    All other components within normal limits  CBC WITH DIFFERENTIAL/PLATELET    EKG EKG Interpretation  Date/Time:  Thursday May 17 2022 15:23:50 EDT Ventricular Rate:  98 PR Interval:  157 QRS Duration: 98 QT Interval:  348 QTC Calculation: 445 R Axis:   2 Text Interpretation: Sinus rhythm Low voltage, precordial leads Confirmed by Virgina Norfolk ([id number]) on 05/17/2022 3:28:43 PM  Radiology DG Chest Portable 1 View  Result Date: 05/17/2022 CLINICAL DATA:  near syncoope hypertension and diabetes EXAM: PORTABLE CHEST 1 VIEW COMPARISON:  November 13, 2017 FINDINGS: The heart size and mediastinal contours are within normal limits stable. Both lungs are clear stable. The visualized skeletal structures are unremarkable. IMPRESSION: No active disease. Electronically Signed   By: Marjo Bicker M.D.   On: 05/17/2022 15:41    Procedures Procedures    Medications Ordered in ED Medications  sodium chloride 0.9 % bolus 1,000 mL (1,000 mLs Intravenous New Bag/Given 05/17/22 1538)    ED Course/ Medical Decision Making/ A&P                           Medical Decision Making Amount and/or Complexity of Data Reviewed Labs: ordered. Radiology: ordered.   Ian Wilson is here after episode of near syncope.  Normal vitals.  No fever.  History of high blood pressure.  Overall well-appearing.  Asymptomatic now.  He was sitting for prolonged period of time and when he stood up he started to feel lightheaded and dizzy like he is in a pass out but did not.  He had an episode of bowel movement and started to feel better.  Was able to eat and drink as well.  Felt like he got back to his normal after an hour or 2.  Differential diagnosis likely vasovagal type event.  I have no concern for stroke or cardiac  or pulmonary event.  We will check for dehydration electrolyte abnormality.  No vision loss or speech changes.  No unilateral weakness.  Did not appear to have any stroke  symptoms.  Did not have chest pain or shortness of breath.  Overall neurologic exam is normal.  CBC, BMP, EKG, chest x-ray was obtained.  Per my review and interpretations of these labs and images there is no acute findings.  No significant anemia or electrolyte abnormality or kidney injury.  EKG shows sinus rhythm.  No ischemic changes.  Chest x-ray showed no signs of pneumonia or pneumothorax.  Recommend he follow-up with primary care doctor.  Discharged in good condition.  This chart was dictated using voice recognition software.  Despite best efforts to proofread,  errors can occur which can change the documentation meaning.         Final Clinical Impression(s) / ED Diagnoses Final diagnoses:  Near syncope    Rx / DC Orders ED Discharge Orders     None         Virgina Norfolk, DO 05/17/22 1607

## 2022-05-17 NOTE — ED Notes (Signed)
Pt ambulatory without assistance.  

## 2022-05-17 NOTE — ED Triage Notes (Signed)
Pt stood up at desk today, felt dizzy, room spinning, he walked to cafeteria by holding onto wall, this started about 1230. He ate, drank a slim fast. This lasted about 2 hours total, pt states he felt "kinda out of it".

## 2022-07-08 ENCOUNTER — Emergency Department (HOSPITAL_BASED_OUTPATIENT_CLINIC_OR_DEPARTMENT_OTHER): Payer: BLUE CROSS/BLUE SHIELD | Admitting: Radiology

## 2022-07-08 ENCOUNTER — Emergency Department (HOSPITAL_BASED_OUTPATIENT_CLINIC_OR_DEPARTMENT_OTHER): Payer: BLUE CROSS/BLUE SHIELD

## 2022-07-08 ENCOUNTER — Emergency Department (HOSPITAL_BASED_OUTPATIENT_CLINIC_OR_DEPARTMENT_OTHER)
Admission: EM | Admit: 2022-07-08 | Discharge: 2022-07-08 | Disposition: A | Discharge status: Home / Self Care | Payer: BLUE CROSS/BLUE SHIELD | Attending: Emergency Medicine | Admitting: Emergency Medicine

## 2022-07-08 ENCOUNTER — Encounter (HOSPITAL_BASED_OUTPATIENT_CLINIC_OR_DEPARTMENT_OTHER): Payer: Self-pay | Admitting: *Deleted

## 2022-07-08 ENCOUNTER — Other Ambulatory Visit: Payer: Self-pay

## 2022-07-08 DIAGNOSIS — R944 Abnormal results of kidney function studies: Secondary | ICD-10-CM | POA: Diagnosis not present

## 2022-07-08 DIAGNOSIS — Z91010 Allergy to peanuts: Secondary | ICD-10-CM | POA: Insufficient documentation

## 2022-07-08 DIAGNOSIS — Z7982 Long term (current) use of aspirin: Secondary | ICD-10-CM | POA: Insufficient documentation

## 2022-07-08 DIAGNOSIS — R1013 Epigastric pain: Secondary | ICD-10-CM | POA: Diagnosis not present

## 2022-07-08 DIAGNOSIS — R197 Diarrhea, unspecified: Secondary | ICD-10-CM | POA: Insufficient documentation

## 2022-07-08 LAB — HEPATIC FUNCTION PANEL
ALT: 17 U/L (ref 0–44)
AST: 13 U/L — ABNORMAL LOW (ref 15–41)
Albumin: 4.1 g/dL (ref 3.5–5.0)
Alkaline Phosphatase: 53 U/L (ref 38–126)
Bilirubin, Direct: <0.1 mg/dL (ref 0.0–0.2)
Total Bilirubin: 0.3 mg/dL (ref 0.3–1.2)
Total Protein: 6.9 g/dL (ref 6.5–8.1)

## 2022-07-08 LAB — CBC
HCT: 44.9 % (ref 39.0–52.0)
Hemoglobin: 14.4 g/dL (ref 13.0–17.0)
MCH: 29.9 pg (ref 26.0–34.0)
MCHC: 32.1 g/dL (ref 30.0–36.0)
MCV: 93.3 fL (ref 80.0–100.0)
Platelets: 241 10*3/uL (ref 150–400)
RBC: 4.81 MIL/uL (ref 4.22–5.81)
RDW: 13.4 % (ref 11.5–15.5)
WBC: 7 10*3/uL (ref 4.0–10.5)
nRBC: 0 % (ref 0.0–0.2)

## 2022-07-08 LAB — BASIC METABOLIC PANEL
Anion gap: 9 (ref 5–15)
BUN: 15 mg/dL (ref 6–20)
CO2: 25 mmol/L (ref 22–32)
Calcium: 8.9 mg/dL (ref 8.9–10.3)
Chloride: 106 mmol/L (ref 98–111)
Creatinine, Ser: 1.33 mg/dL — ABNORMAL HIGH (ref 0.61–1.24)
GFR, Estimated: >60 mL/min (ref >60)
Glucose, Bld: 180 mg/dL — ABNORMAL HIGH (ref 70–99)
Potassium: 3.8 mmol/L (ref 3.5–5.1)
Sodium: 140 mmol/L (ref 135–145)

## 2022-07-08 LAB — TROPONIN I (HIGH SENSITIVITY)
Troponin I (High Sensitivity): 2 ng/L (ref <18)
Troponin I (High Sensitivity): 4 ng/L (ref <18)

## 2022-07-08 MED ORDER — ALUM & MAG HYDROXIDE-SIMETH 200-200-20 MG/5ML PO SUSP
30.0000 mL | Freq: Once | ORAL | Status: AC
  Administered 2022-07-08: 30 mL via ORAL
  Filled 2022-07-08: qty 30

## 2022-07-08 MED ORDER — KETOROLAC TROMETHAMINE 30 MG/ML IJ SOLN
15.0000 mg | Freq: Once | INTRAMUSCULAR | Status: AC
  Administered 2022-07-08: 15 mg via INTRAVENOUS
  Filled 2022-07-08: qty 1

## 2022-07-08 MED ORDER — DICYCLOMINE HCL 10 MG/ML IM SOLN
20.0000 mg | Freq: Once | INTRAMUSCULAR | Status: AC
  Administered 2022-07-08: 20 mg via INTRAMUSCULAR
  Filled 2022-07-08: qty 2

## 2022-07-08 MED ORDER — OMEPRAZOLE 20 MG PO CPDR: 20.0000 mg | DELAYED_RELEASE_CAPSULE | Freq: Every day | ORAL | 0 refills | Status: AC

## 2022-07-08 MED ORDER — SUCRALFATE 1 GM/10ML PO SUSP
1.0000 g | Freq: Three times a day (TID) | ORAL | Status: DC
  Administered 2022-07-08: 1 g via ORAL
  Filled 2022-07-08: qty 10

## 2022-07-08 MED ORDER — ONDANSETRON 8 MG PO TBDP: ORAL_TABLET | ORAL | 0 refills | Status: AC

## 2022-07-08 MED ORDER — ONDANSETRON HCL 4 MG/2ML IJ SOLN
4.0000 mg | Freq: Once | INTRAMUSCULAR | Status: AC
  Administered 2022-07-08: 4 mg via INTRAVENOUS
  Filled 2022-07-08: qty 2

## 2022-07-08 NOTE — ED Triage Notes (Signed)
Pt states that he ate some barbecue and had epigastric pain and pressure which was unrelieved by alka seltzer.  Pt continues to have epigastric pressure.  Pt denies any n/v diaphoresis and says that he is breathing shallow.

## 2022-07-08 NOTE — ED Notes (Signed)
Patient transported to X-ray 

## 2022-07-08 NOTE — Discharge Instructions (Addendum)
Today you were seen in the emergency department for your chest/upper abdominal discomfort.    In the emergency department you had an EKG, lab work (including troponins), and a chest x-ray that were reassuring.    At home, please take your home medications and over-the-counter antacids.    Follow-up with your primary doctor in 2-3 days regarding your visit.    Return immediately to the emergency department if you experience any of the following: Unexplained sweating, vomiting, worsening pain, difficulty breathing, or any other concerning symptoms.    Thank you for visiting our Emergency Department. It was a pleasure taking care of you today.

## 2022-07-08 NOTE — ED Provider Notes (Signed)
  Physical Exam  BP (!) 114/102   Pulse 87   Temp 97.9 F (36.6 C) (Oral)   Resp 16   Ht 5\' 11"  (1.803 m)   Wt 133.8 kg Comment: Simultaneous filing. User may not have seen previous data.  SpO2 97%   BMI 41.14 kg/m   Physical Exam  Procedures  Procedures  ED Course / MDM   Clinical Course as of 07/08/22 0945  Sun Jul 08, 2022  0652 Assumed care from Dr Randal Buba. 61 yo M who at potato salad that was not refrigerated. Has epigastric abdominal pain. Also getting ruled out for MI. Initial trop was 2 with repeat pending now.  [RP]  0910 CMP and repeat troponin WNL. [RP]  [phone] Patient reassessed.  He is feeling much better.  Informed the patient of the findings from his visit today.  Discussed return precautions with him and the fact that he will need to follow-up with his primary doctor in several days.  Recommended that he keep taking his PPI and over-the-counter antacids as needed for any epigastric discomfort that he may have. [RP]    Clinical Course User Index [RP] Fransico Meadow, MD   Medical Decision Making Amount and/or Complexity of Data Reviewed Labs: ordered. Radiology: ordered.  Risk OTC drugs. Prescription drug management.         Fransico Meadow, MD 07/08/22 [phone]

## 2022-07-08 NOTE — ED Notes (Signed)
Dc instructions reviewed with patient. Patient voiced understanding. Dc with belongings.  °

## 2022-07-08 NOTE — ED Provider Notes (Signed)
[facility] EMERGENCY DEPT Provider Note   CSN: [id number] Arrival date & time: 07/08/22  0458     History  Chief Complaint  Patient presents with   Abdominal Pain    Ian Wilson is a 61 y.o. male.  The history is provided by the patient.  Abdominal Pain Pain location:  Epigastric Pain quality: aching   Pain radiates to:  Chest Pain severity:  Moderate Onset quality:  Sudden Timing:  Constant Progression:  Unchanged Chronicity:  New Context: eating   Context comment:  Ate pulled pork with barbecue and potato salad Relieved by:  Nothing Worsened by:  Nothing Ineffective treatments:  None tried Associated symptoms: diarrhea   Associated symptoms: no anorexia, no cough, no dysuria, no fever, no shortness of breath and no vomiting   Risk factors: no recent hospitalization        Home Medications Prior to Admission medications   Medication Sig Start Date End Date Taking? Authorizing Provider  allopurinol (ZYLOPRIM) 300 MG tablet Take 1 tablet (300 mg total) by mouth daily. 11/03/19   Wanda Plump, MD  amoxicillin (AMOXIL) 500 MG tablet Take 4 tabs po 30 mins before procedure 02/23/19   Cristie Hem, PA-C  aspirin EC 81 MG tablet Take 1 tablet (81 mg total) by mouth 2 (two) times daily. 09/01/18   Tarry Kos, MD  colchicine 0.6 MG tablet Take on pill po bid until symptoms resolve 09/16/18   Cristie Hem, PA-C  ondansetron (ZOFRAN) 4 MG tablet Take 1-2 tablets (4-8 mg total) by mouth every 8 (eight) hours as needed for nausea or vomiting. Patient not taking: Reported on 10/10/2018 09/01/18   Tarry Kos, MD  senna-docusate (SENOKOT S) 8.6-50 MG tablet Take 1 tablet by mouth at bedtime as needed. 09/01/18   Tarry Kos, MD      Allergies    Bee venom, Peanut oil, and Shellfish-derived products    Review of Systems   Review of Systems  Constitutional:  Negative for fever.  HENT:  Negative for facial swelling.   Eyes:  Negative for  redness.  Respiratory:  Negative for cough and shortness of breath.   Cardiovascular:  Negative for palpitations and leg swelling.  Gastrointestinal:  Positive for abdominal pain and diarrhea. Negative for anorexia and vomiting.  Genitourinary:  Negative for dysuria.  Neurological:  Negative for facial asymmetry.  All other systems reviewed and are negative.   Physical Exam Updated Vital Signs BP (!) 154/102 (BP Location: Right Arm)   Pulse 84   Temp 98.5 F (36.9 C) (Oral)   Resp 18   Ht 5\' 11"  (1.803 m)   Wt 133.8 kg Comment: Simultaneous filing. User may not have seen previous data.  SpO2 98%   BMI 41.14 kg/m  Physical Exam Vitals and nursing note reviewed.  Constitutional:      General: He is not in acute distress.    Appearance: He is well-developed. He is not diaphoretic.  HENT:     Head: Normocephalic and atraumatic.     Nose: Nose normal.  Eyes:     Conjunctiva/sclera: Conjunctivae normal.     Pupils: Pupils are equal, round, and reactive to light.  Cardiovascular:     Rate and Rhythm: Normal rate and regular rhythm.  Pulmonary:     Effort: Pulmonary effort is normal.     Breath sounds: Normal breath sounds. No wheezing or rales.  Abdominal:     General: Bowel sounds are normal.  Palpations: Abdomen is soft.     Tenderness: There is no abdominal tenderness. There is no guarding or rebound.  Musculoskeletal:        General: Normal range of motion.     Cervical back: Normal range of motion and neck supple.  Skin:    General: Skin is warm and dry.     Capillary Refill: Capillary refill takes less than 2 seconds.  Neurological:     General: No focal deficit present.     Mental Status: He is alert and oriented to person, place, and time.  Psychiatric:        Mood and Affect: Mood normal.        Behavior: Behavior normal.     ED Results / Procedures / Treatments   Labs (all labs ordered are listed, but only abnormal results are displayed) Results for  orders placed or performed during the hospital encounter of 07/08/22  Basic metabolic panel  Result Value Ref Range   Sodium 140 135 - 145 mmol/L   Potassium 3.8 3.5 - 5.1 mmol/L   Chloride 106 98 - 111 mmol/L   CO2 25 22 - 32 mmol/L   Glucose, Bld 180 (H) 70 - 99 mg/dL   BUN 15 6 - 20 mg/dL   Creatinine, Ser 8.36 (H) 0.61 - 1.24 mg/dL   Calcium 8.9 8.9 - 62.9 mg/dL   GFR, Estimated >47 >65 mL/min   Anion gap 9 5 - 15  CBC  Result Value Ref Range   WBC 7.0 4.0 - 10.5 K/uL   RBC 4.81 4.22 - 5.81 MIL/uL   Hemoglobin 14.4 13.0 - 17.0 g/dL   HCT 46.5 03.5 - 46.5 %   MCV 93.3 80.0 - 100.0 fL   MCH 29.9 26.0 - 34.0 pg   MCHC 32.1 30.0 - 36.0 g/dL   RDW 68.1 27.5 - 17.0 %   Platelets 241 150 - 400 K/uL   nRBC 0.0 0.0 - 0.2 %  Troponin I (High Sensitivity)  Result Value Ref Range   Troponin I (High Sensitivity) 2 <18 ng/L   CT Renal Stone Study  Result Date: 07/08/2022 CLINICAL DATA:  61 year old male with history of flank pain and epigastric pressure/pain. EXAM: CT ABDOMEN AND PELVIS WITHOUT CONTRAST TECHNIQUE: Multidetector CT imaging of the abdomen and pelvis was performed following the standard protocol without IV contrast. RADIATION DOSE REDUCTION: This exam was performed according to the departmental dose-optimization program which includes automated exposure control, adjustment of the mA and/or kV according to patient size and/or use of iterative reconstruction technique. COMPARISON:  CT of the abdomen and pelvis 03/29/2020. FINDINGS: Lower chest: Unremarkable. Hepatobiliary: Mild diffuse low attenuation throughout the hepatic parenchyma, indicative of a background of hepatic steatosis. No definite suspicious cystic or solid hepatic lesions are confidently identified on today's noncontrast CT examination. Unenhanced appearance of the gallbladder is normal. Pancreas: No definite pancreatic mass or peripancreatic fluid collections or inflammatory changes are noted on today's noncontrast  CT examination. Spleen: Unremarkable. Adrenals/Urinary Tract: There are no abnormal calcifications within the collecting system of either kidney, along the course of either ureter, or within the lumen of the urinary bladder. No hydroureteronephrosis or perinephric stranding to suggest urinary tract obstruction at this time. The unenhanced appearance of the kidneys is unremarkable bilaterally. Urinary bladder is completely decompressed, but otherwise unremarkable in appearance. Bilateral adrenal glands are normal in appearance. Stomach/Bowel: The unenhanced appearance of the stomach is normal. No pathologic dilatation of small bowel or colon. Normal appendix. Suture  line in the mid small bowel from prior partial small bowel resection. Suture line in the sigmoid colon from prior partial colectomy. Vascular/Lymphatic: Mild atherosclerotic calcifications in the abdominal aorta and pelvic vasculature. No lymphadenopathy noted in the abdomen or pelvis. Reproductive: Prostate gland and seminal vesicles are unremarkable in appearance. Other: No significant volume of ascites.  No pneumoperitoneum. Musculoskeletal: There are no aggressive appearing lytic or blastic lesions noted in the visualized portions of the skeleton. IMPRESSION: 1. No acute findings are noted in the abdomen or pelvis to account for the patient's symptoms. Specifically, no urinary tract calculi no findings of urinary tract obstruction are noted at this time. 2. Hepatic steatosis. 3. Aortic atherosclerosis. 4. Additional incidental findings, as above. Electronically Signed   By: Vinnie Langton M.D.   On: 07/08/2022 06:20   DG Chest 2 View  Result Date: 07/08/2022 CLINICAL DATA:  61 year old male with history of epigastric pain. EXAM: CHEST - 2 VIEW COMPARISON:  Chest x-ray 05/17/2022. FINDINGS: Lung volumes are normal. No consolidative airspace disease. No pleural effusions. No pneumothorax. No pulmonary nodule or mass noted. Pulmonary vasculature  and the cardiomediastinal silhouette are within normal limits. IMPRESSION: No radiographic evidence of acute cardiopulmonary disease. Electronically Signed   By: Vinnie Langton M.D.   On: 07/08/2022 05:26      EKG EKG Interpretation  Date/Time:  Sunday July 08 2022 05:04:57 EDT Ventricular Rate:  81 PR Interval:  174 QRS Duration: 84 QT Interval:  374 QTC Calculation: 434 R Axis:   5 Text Interpretation: Normal sinus rhythm Confirmed by Dory Horn) on 07/08/2022 5:19:28 AM  Radiology CT Renal Stone Study  Result Date: 07/08/2022 CLINICAL DATA:  62 year old male with history of flank pain and epigastric pressure/pain. EXAM: CT ABDOMEN AND PELVIS WITHOUT CONTRAST TECHNIQUE: Multidetector CT imaging of the abdomen and pelvis was performed following the standard protocol without IV contrast. RADIATION DOSE REDUCTION: This exam was performed according to the departmental dose-optimization program which includes automated exposure control, adjustment of the mA and/or kV according to patient size and/or use of iterative reconstruction technique. COMPARISON:  CT of the abdomen and pelvis 03/29/2020. FINDINGS: Lower chest: Unremarkable. Hepatobiliary: Mild diffuse low attenuation throughout the hepatic parenchyma, indicative of a background of hepatic steatosis. No definite suspicious cystic or solid hepatic lesions are confidently identified on today's noncontrast CT examination. Unenhanced appearance of the gallbladder is normal. Pancreas: No definite pancreatic mass or peripancreatic fluid collections or inflammatory changes are noted on today's noncontrast CT examination. Spleen: Unremarkable. Adrenals/Urinary Tract: There are no abnormal calcifications within the collecting system of either kidney, along the course of either ureter, or within the lumen of the urinary bladder. No hydroureteronephrosis or perinephric stranding to suggest urinary tract obstruction at this time. The  unenhanced appearance of the kidneys is unremarkable bilaterally. Urinary bladder is completely decompressed, but otherwise unremarkable in appearance. Bilateral adrenal glands are normal in appearance. Stomach/Bowel: The unenhanced appearance of the stomach is normal. No pathologic dilatation of small bowel or colon. Normal appendix. Suture line in the mid small bowel from prior partial small bowel resection. Suture line in the sigmoid colon from prior partial colectomy. Vascular/Lymphatic: Mild atherosclerotic calcifications in the abdominal aorta and pelvic vasculature. No lymphadenopathy noted in the abdomen or pelvis. Reproductive: Prostate gland and seminal vesicles are unremarkable in appearance. Other: No significant volume of ascites.  No pneumoperitoneum. Musculoskeletal: There are no aggressive appearing lytic or blastic lesions noted in the visualized portions of the skeleton. IMPRESSION: 1. No acute  findings are noted in the abdomen or pelvis to account for the patient's symptoms. Specifically, no urinary tract calculi no findings of urinary tract obstruction are noted at this time. 2. Hepatic steatosis. 3. Aortic atherosclerosis. 4. Additional incidental findings, as above. Electronically Signed   By: Trudie Reed M.D.   On: 07/08/2022 06:20   DG Chest 2 View  Result Date: 07/08/2022 CLINICAL DATA:  61 year old male with history of epigastric pain. EXAM: CHEST - 2 VIEW COMPARISON:  Chest x-ray 05/17/2022. FINDINGS: Lung volumes are normal. No consolidative airspace disease. No pleural effusions. No pneumothorax. No pulmonary nodule or mass noted. Pulmonary vasculature and the cardiomediastinal silhouette are within normal limits. IMPRESSION: No radiographic evidence of acute cardiopulmonary disease. Electronically Signed   By: Trudie Reed M.D.   On: 07/08/2022 05:26    Procedures Procedures    Medications Ordered in ED Medications  ketorolac (TORADOL) 30 MG/ML injection 15 mg (has  no administration in time range)  sucralfate (CARAFATE) 1 GM/10ML suspension 1 g (has no administration in time range)  ondansetron (ZOFRAN) injection 4 mg (4 mg Intravenous Given 07/08/22 0548)  alum & mag hydroxide-simeth (MAALOX/MYLANTA) 200-200-20 MG/5ML suspension 30 mL (30 mLs Oral Given 07/08/22 0547)  dicyclomine (BENTYL) injection 20 mg (20 mg Intramuscular Given 07/08/22 0548)    ED Course/ Medical Decision Making/ A&P                           Medical Decision Making Patient with epigastric pain into the chest since eating barbecue this afternoon,    Amount and/or Complexity of Data Reviewed Independent Historian: spouse    Details: See above  External Data Reviewed: notes.    Details: Previous notes reviewed  Labs: ordered.    Details: All labs reviewed:  negative troponin 2.  Normal white count 7, normal hemoglobin 14.4 normal platelet count.  Normal sodium 140 and potassium 3.8  and creatinine 1.33 slight elevation.   Radiology: ordered and independent interpretation performed.    Details: Ct negative by me, negative chest Xray   Risk OTC drugs. Prescription drug management.    Final Clinical Impression(s) / ED Diagnoses Final diagnoses:  Diarrhea, unspecified type   Signed out to Dr. Eloise Harman pending delta troponin  Rx / DC Orders ED Discharge Orders     None         Zandria Woldt, MD 07/08/22 2774

## 2022-12-12 NOTE — Telephone Encounter (Signed)
Opened in error

## 2023-01-14 ENCOUNTER — Encounter (HOSPITAL_BASED_OUTPATIENT_CLINIC_OR_DEPARTMENT_OTHER): Payer: Self-pay | Admitting: Emergency Medicine

## 2023-01-14 ENCOUNTER — Emergency Department (HOSPITAL_BASED_OUTPATIENT_CLINIC_OR_DEPARTMENT_OTHER)
Admission: EM | Admit: 2023-01-14 | Discharge: 2023-01-14 | Disposition: A | Discharge status: Home / Self Care | Payer: BLUE CROSS/BLUE SHIELD | Attending: Emergency Medicine | Admitting: Emergency Medicine

## 2023-01-14 ENCOUNTER — Emergency Department (HOSPITAL_BASED_OUTPATIENT_CLINIC_OR_DEPARTMENT_OTHER): Payer: BLUE CROSS/BLUE SHIELD | Admitting: Radiology

## 2023-01-14 ENCOUNTER — Other Ambulatory Visit: Payer: Self-pay

## 2023-01-14 DIAGNOSIS — I10 Essential (primary) hypertension: Secondary | ICD-10-CM | POA: Insufficient documentation

## 2023-01-14 DIAGNOSIS — E119 Type 2 diabetes mellitus without complications: Secondary | ICD-10-CM | POA: Diagnosis not present

## 2023-01-14 DIAGNOSIS — R0602 Shortness of breath: Secondary | ICD-10-CM | POA: Insufficient documentation

## 2023-01-14 DIAGNOSIS — R0789 Other chest pain: Secondary | ICD-10-CM | POA: Diagnosis not present

## 2023-01-14 DIAGNOSIS — R110 Nausea: Secondary | ICD-10-CM | POA: Diagnosis not present

## 2023-01-14 DIAGNOSIS — R079 Chest pain, unspecified: Secondary | ICD-10-CM

## 2023-01-14 DIAGNOSIS — K5900 Constipation, unspecified: Secondary | ICD-10-CM | POA: Insufficient documentation

## 2023-01-14 DIAGNOSIS — Z7982 Long term (current) use of aspirin: Secondary | ICD-10-CM | POA: Diagnosis not present

## 2023-01-14 DIAGNOSIS — R1013 Epigastric pain: Secondary | ICD-10-CM | POA: Diagnosis not present

## 2023-01-14 DIAGNOSIS — Z91010 Allergy to peanuts: Secondary | ICD-10-CM | POA: Insufficient documentation

## 2023-01-14 LAB — TROPONIN I (HIGH SENSITIVITY)
Troponin I (High Sensitivity): 3 ng/L (ref <18)
Troponin I (High Sensitivity): 4 ng/L (ref <18)

## 2023-01-14 LAB — BASIC METABOLIC PANEL
Anion gap: 9 (ref 5–15)
BUN: 12 mg/dL (ref 6–20)
CO2: 24 mmol/L (ref 22–32)
Calcium: 8.9 mg/dL (ref 8.9–10.3)
Chloride: 105 mmol/L (ref 98–111)
Creatinine, Ser: 1.13 mg/dL (ref 0.61–1.24)
GFR, Estimated: >60 mL/min (ref >60)
Glucose, Bld: 116 mg/dL — ABNORMAL HIGH (ref 70–99)
Potassium: 4 mmol/L (ref 3.5–5.1)
Sodium: 138 mmol/L (ref 135–145)

## 2023-01-14 LAB — CBC
HCT: 43.5 % (ref 39.0–52.0)
Hemoglobin: 14.1 g/dL (ref 13.0–17.0)
MCH: 30.1 pg (ref 26.0–34.0)
MCHC: 32.4 g/dL (ref 30.0–36.0)
MCV: 92.8 fL (ref 80.0–100.0)
Platelets: 233 10*3/uL (ref 150–400)
RBC: 4.69 MIL/uL (ref 4.22–5.81)
RDW: 13.6 % (ref 11.5–15.5)
WBC: 6.2 10*3/uL (ref 4.0–10.5)
nRBC: 0 % (ref 0.0–0.2)

## 2023-01-14 NOTE — ED Provider Notes (Signed)
[facility] Provider Note   CSN: [id number] Arrival date & time: 01/14/23  1123     History  Chief Complaint  Patient presents with   Chest Pain    Ian Wilson is a 62 y.o. male with past medical history significant for hypertension, diverticulosis, obesity, diabetes presents to the ED complaining of chest pain and nausea that began around 6 AM this morning.  He states that he has had some constipation and when he attempted to have a bowel movement this morning, he began to have epigastric discomfort as well as chest pain.  He reports that he went to work, had a bowel movement, and his chest pain resolved.  Patient does report he did have a brief episode of shortness of breath with his chest pain, but that has also resolved.  He describes his chest pain as tightness.  He is currently reporting no pain.  Denies fever, abdominal pain, vomiting, diarrhea, dizziness, lightheadedness, syncope, weakness.       Home Medications Prior to Admission medications   Medication Sig Start Date End Date Taking? Authorizing Provider  allopurinol (ZYLOPRIM) 300 MG tablet Take 1 tablet (300 mg total) by mouth daily. 11/03/19   Colon Branch, MD  amoxicillin (AMOXIL) 500 MG tablet Take 4 tabs po 30 mins before procedure 02/23/19   Aundra Dubin, PA-C  aspirin EC 81 MG tablet Take 1 tablet (81 mg total) by mouth 2 (two) times daily. 09/01/18   Leandrew Koyanagi, MD  colchicine 0.6 MG tablet Take on pill po bid until symptoms resolve 09/16/18   Aundra Dubin, PA-C  omeprazole (PRILOSEC) 20 MG capsule Take 1 capsule (20 mg total) by mouth daily. 07/08/22   Palumbo, April, MD  ondansetron (ZOFRAN) 4 MG tablet Take 1-2 tablets (4-8 mg total) by mouth every 8 (eight) hours as needed for nausea or vomiting. Patient not taking: Reported on 10/10/2018 09/01/18   Leandrew Koyanagi, MD  ondansetron (ZOFRAN-ODT) 8 MG disintegrating tablet 8mg  ODT q8hours prn nausea 07/08/22    Palumbo, April, MD  senna-docusate (SENOKOT S) 8.6-50 MG tablet Take 1 tablet by mouth at bedtime as needed. 09/01/18   Leandrew Koyanagi, MD      Allergies    Bee venom, Peanut oil, and Shellfish-derived products    Review of Systems   Review of Systems  Constitutional:  Negative for fever.  Respiratory:  Positive for chest tightness and shortness of breath (resolved).   Cardiovascular:  Positive for chest pain (resolved).  Gastrointestinal:  Positive for constipation and nausea. Negative for abdominal pain, diarrhea and vomiting.  Neurological:  Negative for dizziness, syncope, weakness and light-headedness.    Physical Exam Updated Vital Signs BP (!) 140/95   Pulse 84   Temp 98.5 F (36.9 C) (Oral)   Resp 19   Ht 5\' 11"  (1.803 m)   Wt (!) 144.2 kg   SpO2 97%   BMI 44.35 kg/m  Physical Exam Vitals and nursing note reviewed.  Constitutional:      General: He is not in acute distress.    Appearance: He is obese. He is not ill-appearing.  HENT:     Mouth/Throat:     Mouth: Mucous membranes are moist.     Pharynx: Oropharynx is clear.  Cardiovascular:     Rate and Rhythm: Normal rate and regular rhythm.     Pulses: Normal pulses.     Heart sounds: Normal heart sounds.  Pulmonary:  Effort: Pulmonary effort is normal. No respiratory distress.     Breath sounds: Normal breath sounds and air entry.  Abdominal:     General: Abdomen is flat. Bowel sounds are normal. There is no distension.     Palpations: Abdomen is soft.     Tenderness: There is no abdominal tenderness.  Skin:    General: Skin is warm and dry.     Capillary Refill: Capillary refill takes less than 2 seconds.  Neurological:     Mental Status: He is alert. Mental status is at baseline.  Psychiatric:        Mood and Affect: Mood normal.        Behavior: Behavior normal.     ED Results / Procedures / Treatments   Labs (all labs ordered are listed, but only abnormal results are displayed) Labs  Reviewed  BASIC METABOLIC PANEL - Abnormal; Notable for the following components:      Result Value   Glucose, Bld 116 (*)    All other components within normal limits  CBC  TROPONIN I (HIGH SENSITIVITY)  TROPONIN I (HIGH SENSITIVITY)    EKG None  Radiology DG Chest 2 View  Result Date: 01/14/2023 CLINICAL DATA:  chest pain EXAM: CHEST - 2 VIEW COMPARISON:  07/08/2022. FINDINGS: The heart size and mediastinal contours are within normal limits. Both lungs are clear. No pneumothorax or pleural effusion. There are thoracic degenerative changes. IMPRESSION: No active cardiopulmonary disease. Electronically Signed   By: Sammie Bench M.D.   On: 01/14/2023 11:55    Procedures Procedures    Medications Ordered in ED Medications - No data to display  ED Course/ Medical Decision Making/ A&P                             Medical Decision Making Amount and/or Complexity of Data Reviewed Labs: ordered. Radiology: ordered.   This patient presents to the ED with chief complaint(s) of chest pain, constipation with pertinent past medical history of diabetes, hypertension, diverticulosis.  The complaint involves an extensive differential diagnosis and also carries with it a high risk of complications and morbidity.    The differential diagnosis includes ACS, reflux, constipation, gastritis, gastroenteritis  The initial plan is to obtain chest pain workup  Additional history obtained: Records reviewed Primary Care Documents, patient was seen by his family medicine provider on 11/16/2022.  Patient does have hypertension which she takes medication for.  Patient reported at this visit that he does not exercise regularly and only monitors his home blood pressure sometimes.  Patient did not have any chest pain or complaints at this visit.  Initial Assessment:   Exam significant for an obese patient who is not in acute distress.  Patient is resting comfortably upon initial assessment.  Heart rate  is normal in the 80s with regular rhythm.  Lungs are clear to auscultation bilaterally with adequate tidal volume.  Abdomen is soft and nontender to palpation.  Skin is warm and dry.  Vital signs are all stable.  Independent ECG/labs interpretation:  The following labs were independently interpreted:  CBC without leukocytosis or anemia.  Metabolic panel without electrolyte derangement.  Renal function is normal.  Troponin is negative.  Independent visualization and interpretation of imaging: I independently visualized the following imaging with scope of interpretation limited to determining acute life threatening conditions related to emergency care: Chest x-ray, which revealed no evidence of acute cardiopulmonary disease.  I agree with  radiology interpretation.  Disposition:   I suspect patient's symptoms may have been related to constipation and bearing down attempting to defecate.  Recommended patient follow-up with his primary care provider about his symptoms.  Workup here has been very reassuring.  Very low suspicion for ACS given atypical history, patient presentation, and negative workup.  I believe that patient is appropriate for discharge home at this time.  The patient has been appropriately medically screened and/or stabilized in the ED. I have low suspicion for any other emergent medical condition which would require further screening, evaluation or treatment in the ED or require inpatient management. At time of discharge the patient is hemodynamically stable and in no acute distress. I have discussed work-up results and diagnosis with patient and answered all questions. Patient is agreeable with discharge plan. We discussed strict return precautions for returning to the emergency department and they verbalized understanding.            Final Clinical Impression(s) / ED Diagnoses Final diagnoses:  Chest pain, unspecified type  Constipation, unspecified constipation type    Rx  / DC Orders ED Discharge Orders     None         Lenard Simmer, PA-C 01/14/23 1923    Vanetta Mulders, MD 01/18/23 1624

## 2023-01-14 NOTE — ED Notes (Signed)
RN reviewed discharge instructions with pt. Pt verbalized understanding and had no further questions. VSS upon discharge.  

## 2023-01-14 NOTE — ED Triage Notes (Signed)
Pt arrives to ED with c/o chest pain, nausea that started at 6am this morning. He notes it started while being constipated and attempting to have a bowel movement. He notes BM later this afternoon which improved CP. CP was in central chest described as tightness. Current pain 0/10.

## 2023-01-14 NOTE — Discharge Instructions (Addendum)
Thank you for allowing me to be a part of your care today.   Your work-up here today is overall very reassuring.  I do recommend you following up outpatient with cardiology and seeing your primary care provider.   I recommend increasing your fiber and water intake to help avoid constipation.  If you experience constipation, I recommend the use of stool softeners such as Colace or MiraLax.    Return the ED if you have worsening of your symptoms or if you have any new concerns.

## 2023-01-14 NOTE — ED Notes (Signed)
Patient transported to X-ray 

## 2023-01-14 NOTE — ED Notes (Signed)
Pt ambulatory with steady gait to restroom 

## 2023-12-13 ENCOUNTER — Emergency Department (HOSPITAL_BASED_OUTPATIENT_CLINIC_OR_DEPARTMENT_OTHER)
Admission: EM | Admit: 2023-12-13 | Discharge: 2023-12-13 | Disposition: A | Discharge status: Home / Self Care | Payer: BLUE CROSS/BLUE SHIELD | Attending: Emergency Medicine | Admitting: Emergency Medicine

## 2023-12-13 ENCOUNTER — Encounter (HOSPITAL_BASED_OUTPATIENT_CLINIC_OR_DEPARTMENT_OTHER): Payer: Self-pay | Admitting: Urology

## 2023-12-13 DIAGNOSIS — Z96653 Presence of artificial knee joint, bilateral: Secondary | ICD-10-CM | POA: Diagnosis not present

## 2023-12-13 DIAGNOSIS — Z91010 Allergy to peanuts: Secondary | ICD-10-CM | POA: Diagnosis not present

## 2023-12-13 DIAGNOSIS — Z7982 Long term (current) use of aspirin: Secondary | ICD-10-CM | POA: Insufficient documentation

## 2023-12-13 DIAGNOSIS — R42 Dizziness and giddiness: Secondary | ICD-10-CM | POA: Insufficient documentation

## 2023-12-13 DIAGNOSIS — E119 Type 2 diabetes mellitus without complications: Secondary | ICD-10-CM | POA: Diagnosis not present

## 2023-12-13 DIAGNOSIS — R030 Elevated blood-pressure reading, without diagnosis of hypertension: Secondary | ICD-10-CM

## 2023-12-13 DIAGNOSIS — H81399 Other peripheral vertigo, unspecified ear: Secondary | ICD-10-CM

## 2023-12-13 DIAGNOSIS — I10 Essential (primary) hypertension: Secondary | ICD-10-CM | POA: Insufficient documentation

## 2023-12-13 DIAGNOSIS — Z79899 Other long term (current) drug therapy: Secondary | ICD-10-CM | POA: Insufficient documentation

## 2023-12-13 LAB — BASIC METABOLIC PANEL
Anion gap: 8 (ref 5–15)
BUN: 12 mg/dL (ref 8–23)
CO2: 27 mmol/L (ref 22–32)
Calcium: 8.7 mg/dL — ABNORMAL LOW (ref 8.9–10.3)
Chloride: 103 mmol/L (ref 98–111)
Creatinine, Ser: 1.09 mg/dL (ref 0.61–1.24)
GFR, Estimated: >60 mL/min (ref >60)
Glucose, Bld: 108 mg/dL — ABNORMAL HIGH (ref 70–99)
Potassium: 4.2 mmol/L (ref 3.5–5.1)
Sodium: 138 mmol/L (ref 135–145)

## 2023-12-13 LAB — CBC
HCT: 44.9 % (ref 39.0–52.0)
Hemoglobin: 14.7 g/dL (ref 13.0–17.0)
MCH: 30.2 pg (ref 26.0–34.0)
MCHC: 32.7 g/dL (ref 30.0–36.0)
MCV: 92.4 fL (ref 80.0–100.0)
Platelets: 233 10*3/uL (ref 150–400)
RBC: 4.86 MIL/uL (ref 4.22–5.81)
RDW: 13.5 % (ref 11.5–15.5)
WBC: 6.5 10*3/uL (ref 4.0–10.5)
nRBC: 0 % (ref 0.0–0.2)

## 2023-12-13 LAB — TROPONIN I (HIGH SENSITIVITY): Troponin I (High Sensitivity): 4 ng/L (ref <18)

## 2023-12-13 MED ORDER — MECLIZINE HCL 25 MG PO TABS: 25.0000 mg | ORAL_TABLET | Freq: Three times a day (TID) | ORAL | 0 refills | Status: AC | PRN

## 2023-12-13 NOTE — ED Provider Notes (Signed)
 [place] [facility] AT [facility] Provider Note  CSN: [id number] Arrival date & time: 12/13/23 1318  Chief Complaint(s) Hypertension  HPI Ian Wilson is a 63 y.o. male with past medical history as below, significant for dm, hld, diverticulosis, HTN, obesity, s/p TKR R who presents to the ED with complaint of elevated bp  Bp elevated this morning while at work, reported 170/110 pta, took his medication, bp has now improved. He was dizzy/ room spinning sensation prior to checking his bp, felt like vertigo. No recent diet/medication changes, no excessive stimulant use. No cp or dib, no change to po or uop/bowel function.   Per OSH chart review he takes for HTN: Norvasc 5mg  every day Cozaar 100mg  every day    Past Medical History Past Medical History:  Diagnosis Date   Arthritis    primary localized osteoarthritis B/L knees   Diabetes mellitus without complication (HCC)    Diverticulosis    Gout    Headache    HTN (hypertension)    h/o- on medication 20 years ago   Obesity    Wears glasses    Patient Active Problem List   Diagnosis Date Noted   Primary osteoarthritis of right knee 09/01/2018   S/P TKR (total knee replacement), right 09/01/2018   Morbid obesity (HCC) 05/13/2018   Body mass index 40.0-44.9, adult (HCC) 05/13/2018   Presence of left artificial knee joint 02/10/2018   S/P TKR (total knee replacement), left 11/21/2017   PCP NOTES >>>>>>>>>>>>>>>>> 06/19/2016   Diverticulitis of intestine without perforation or abscess without bleeding 08/02/2015   Ankle pain 12/16/2014   Diarrhea, Intermittent, lactose intolerance ? 04/13/2014   Annual physical exam 12/25/2013   DJD (degenerative joint disease) 11/18/2013   Gout 10/29/2012   HTN (hypertension)    Home Medication(s) Prior to Admission medications   Medication Sig Start Date End Date Taking? Authorizing Provider  allopurinol (ZYLOPRIM) 300 MG tablet Take 1 tablet (300 mg total)  by mouth daily. 11/03/19   Wanda Plump, MD  amoxicillin (AMOXIL) 500 MG tablet Take 4 tabs po 30 mins before procedure 02/23/19   Cristie Hem, PA-C  aspirin EC 81 MG tablet Take 1 tablet (81 mg total) by mouth 2 (two) times daily. 09/01/18   Tarry Kos, MD  colchicine 0.6 MG tablet Take on pill po bid until symptoms resolve 09/16/18   Cristie Hem, PA-C  omeprazole (PRILOSEC) 20 MG capsule Take 1 capsule (20 mg total) by mouth daily. 07/08/22   Palumbo, April, MD  ondansetron (ZOFRAN) 4 MG tablet Take 1-2 tablets (4-8 mg total) by mouth every 8 (eight) hours as needed for nausea or vomiting. Patient not taking: Reported on 10/10/2018 09/01/18   Tarry Kos, MD  ondansetron (ZOFRAN-ODT) 8 MG disintegrating tablet 8mg  ODT q8hours prn nausea 07/08/22   Palumbo, April, MD  senna-docusate (SENOKOT S) 8.6-50 MG tablet Take 1 tablet by mouth at bedtime as needed. 09/01/18   Tarry Kos, MD  Past Surgical History Past Surgical History:  Procedure Laterality Date   COLONOSCOPY     TOTAL KNEE ARTHROPLASTY Left 11/21/2017   Procedure: LEFT TOTAL KNEE ARTHROPLASTY;  Surgeon: Tarry Kos, MD;  Location: [facility] OR;  Service: Orthopedics;  Laterality: Left;   TOTAL KNEE ARTHROPLASTY Right 09/01/2018   Procedure: RIGHT TOTAL KNEE ARTHROPLASTY;  Surgeon: Tarry Kos, MD;  Location: [facility] OR;  Service: Orthopedics;  Laterality: Right;   Family History Family History  Problem Relation Age of Onset   CAD Mother        CABG in her 29s   Stroke Mother    Diabetes Mother    Prostate cancer Father 61   Colon cancer Neg Hx     Social History Social History   Tobacco Use   Smoking status: Never   Smokeless tobacco: Never  Vaping Use   Vaping status: Never Used  Substance Use Topics   Alcohol use: No   Drug use: No   Allergies Bee venom, Peanut oil, and  Shellfish-derived products  Review of Systems A thorough review of systems was obtained and all systems are negative except as noted in the HPI and PMH.   Physical Exam Vital Signs  I have reviewed the triage vital signs BP (!) 136/93 (BP Location: Left Arm)   Pulse 83   Temp (!) 97 F (36.1 C)   Resp 18   Ht 5\' 11"  (1.803 m)   Wt (!) 144.2 kg   SpO2 99%   BMI 44.34 kg/m  Physical Exam Vitals and nursing note reviewed.  Constitutional:      General: He is not in acute distress.    Appearance: Normal appearance. He is well-developed. He is obese.  HENT:     Head: Normocephalic and atraumatic.     Right Ear: External ear normal.     Left Ear: External ear normal.     Mouth/Throat:     Mouth: Mucous membranes are moist.  Eyes:     General: No scleral icterus.    Extraocular Movements: Extraocular movements intact.     Pupils: Pupils are equal, round, and reactive to light.  Cardiovascular:     Rate and Rhythm: Normal rate and regular rhythm.     Pulses: Normal pulses.     Heart sounds: Normal heart sounds.  Pulmonary:     Effort: Pulmonary effort is normal. No respiratory distress.     Breath sounds: Normal breath sounds.  Abdominal:     General: Abdomen is flat.     Palpations: Abdomen is soft.     Tenderness: There is no abdominal tenderness.  Musculoskeletal:     Cervical back: No rigidity.     Right lower leg: No edema.     Left lower leg: No edema.  Skin:    General: Skin is warm and dry.     Capillary Refill: Capillary refill takes less than 2 seconds.  Neurological:     Mental Status: He is alert and oriented to person, place, and time.     GCS: GCS eye subscore is 4. GCS verbal subscore is 5. GCS motor subscore is 6.     Cranial Nerves: Cranial nerves 2-12 are intact. No dysarthria.     Sensory: Sensation is intact.     Motor: Motor function is intact. No tremor or pronator drift.     Coordination: Coordination is intact. Coordination normal.  Finger-Nose-Finger Test normal.     Gait: Gait is intact.  Comments: Strength 5/5 to BLUE/BLLE, equal and symmetric    Psychiatric:        Mood and Affect: Mood normal.        Behavior: Behavior normal.     ED Results and Treatments Labs (all labs ordered are listed, but only abnormal results are displayed) Labs Reviewed  BASIC METABOLIC PANEL - Abnormal; Notable for the following components:      Result Value   Glucose, Bld 108 (*)    Calcium 8.7 (*)    All other components within normal limits  CBC  TROPONIN I (HIGH SENSITIVITY)  TROPONIN I (HIGH SENSITIVITY)                                                                                                                          Radiology No results found.  Pertinent labs & imaging results that were available during my care of the patient were reviewed by me and considered in my medical decision making (see MDM for details).  Medications Ordered in ED Medications - No data to display                                                                                                                                   Procedures Procedures  (including critical care time)  Medical Decision Making / ED Course    Medical Decision Making:    Ian Wilson is a 63 y.o. male with past medical history as below, significant for dm, diverticulosis, HTN, obesity, s/p TKR R who presents to the ED with complaint of elevated bp. The complaint involves an extensive differential diagnosis and also carries with it a high risk of complications and morbidity.  Serious etiology was considered. Ddx includes but is not limited to: hypertensive emergency, vertigo central vs peripheral, cva, tia, metabolic derangement, cardiac etiology, etc  Complete initial physical exam performed, notably the patient was in no distress, symptoms resolved .    Reviewed and confirmed nursing documentation for past medical history, family history, social  history.  Vital signs reviewed.       Brief summary: 63 yo male w/ hx as above here with elev bp, dizzy sensation Symptoms have resolved upon my evaluation, bp has normalized as well Neuro exam is nonfocal Dizzy sensation seems most c/w peripheral vertigo, doubt acute cva/tia/central vertigo or other acute intracranial or neuro etiology. He has felt this in the  past but did resolve on its own D/w supportive care at home Start antivert F/u pcp  The patient improved significantly and was discharged in stable condition. Detailed discussions were had with the patient/guardian regarding current findings, and need for close f/u with PCP or on call doctor. The patient/guardian has been instructed to return immediately if the symptoms worsen in any way for re-evaluation. Patient/guardian verbalized understanding and is in agreement with current care plan. All questions answered prior to discharge.                  Additional history obtained: -Additional history obtained from na -External records from outside source obtained and reviewed including: Chart review including previous notes, labs, imaging, consultation notes including  [facility] pcp documentation, home medications   Lab Tests: -I ordered, reviewed, and interpreted labs.   The pertinent results include:   Labs Reviewed  BASIC METABOLIC PANEL - Abnormal; Notable for the following components:      Result Value   Glucose, Bld 108 (*)    Calcium 8.7 (*)    All other components within normal limits  CBC  TROPONIN I (HIGH SENSITIVITY)  TROPONIN I (HIGH SENSITIVITY)    Notable for labs stable  EKG   EKG Interpretation Date/Time:  Friday December 13 2023 13:29:50 EST Ventricular Rate:  78 PR Interval:  161 QRS Duration:  101 QT Interval:  378 QTC Calculation: 431 R Axis:   -10  Text Interpretation: Sinus rhythm Low voltage, precordial leads Abnormal R-wave progression, early transition No significant change since  last tracing Confirmed by Linwood Dibbles [phone]) on 12/13/2023 1:34:51 PM         Imaging Studies ordered: na   Medicines ordered and prescription drug management: No orders of the defined types were placed in this encounter.   -I have reviewed the patients home medicines and have made adjustments as needed   Consultations Obtained: na   Cardiac Monitoring: The patient was maintained on a cardiac monitor.  I personally viewed and interpreted the cardiac monitored which showed an underlying rhythm of: NSR Continuous pulse oximetry interpreted by myself, 100% on RA.    Social Determinants of Health:  Diagnosis or treatment significantly limited by social determinants of health: obesity   Reevaluation: After the interventions noted above, I reevaluated the patient and found that they have resolved  Co morbidities that complicate the patient evaluation  Past Medical History:  Diagnosis Date   Arthritis    primary localized osteoarthritis B/L knees   Diabetes mellitus without complication (HCC)    Diverticulosis    Gout    Headache    HTN (hypertension)    h/o- on medication 20 years ago   Obesity    Wears glasses       Dispostion: Disposition decision including need for hospitalization was considered, and patient discharged from emergency department.    Final Clinical Impression(s) / ED Diagnoses Final diagnoses:  Peripheral vertigo, unspecified laterality        Sloan Leiter, DO 12/13/23 1556

## 2023-12-13 NOTE — Discharge Instructions (Signed)
 It was a pleasure caring for you today in the emergency department.  Please return to the emergency department for any worsening or worrisome symptoms.

## 2023-12-13 NOTE — ED Triage Notes (Signed)
 Pt states blood pressure high, started having dizziness at 1100 today  States BP was 170/110 at 1200, then 185/140 per pt  Takes BP meds and took them today  Denies pain    Now better at 136/93 NAD noted

## 2024-04-19 ENCOUNTER — Emergency Department (HOSPITAL_BASED_OUTPATIENT_CLINIC_OR_DEPARTMENT_OTHER)
Admission: EM | Admit: 2024-04-19 | Discharge: 2024-04-20 | Disposition: A | Discharge status: Home / Self Care | Attending: Emergency Medicine | Admitting: Emergency Medicine

## 2024-04-19 DIAGNOSIS — Z96659 Presence of unspecified artificial knee joint: Secondary | ICD-10-CM | POA: Diagnosis not present

## 2024-04-19 DIAGNOSIS — I10 Essential (primary) hypertension: Secondary | ICD-10-CM | POA: Insufficient documentation

## 2024-04-19 DIAGNOSIS — E1165 Type 2 diabetes mellitus with hyperglycemia: Secondary | ICD-10-CM | POA: Insufficient documentation

## 2024-04-19 DIAGNOSIS — R739 Hyperglycemia, unspecified: Secondary | ICD-10-CM

## 2024-04-19 DIAGNOSIS — Z7982 Long term (current) use of aspirin: Secondary | ICD-10-CM | POA: Insufficient documentation

## 2024-04-19 DIAGNOSIS — Z91010 Allergy to peanuts: Secondary | ICD-10-CM | POA: Diagnosis not present

## 2024-04-19 LAB — CBG MONITORING, ED: Glucose-Capillary: 457 mg/dL — ABNORMAL HIGH (ref 70–99)

## 2024-04-19 NOTE — ED Triage Notes (Addendum)
 Patient arrived POV from home with complaint of high blood sugar. Reports is Type 2 diabetic, was recently placed on prednisone  for bronchitis on 6/28.   Patient noticed starting on 6/21 of increased urinary frequency and fluid intake. Went to UC on 04/17/24 FS 561, was instructed to increase fluids and double Metformin. Patient reports feeling better but still fatigued from bronchitis.   Reports when eating feels like food is bypassing stomach and lands where it lands Denies diarrhea & vomiting.   Patient ran out of medication so wife administered her Ozempic shot on 04/17/24.

## 2024-04-20 ENCOUNTER — Encounter (HOSPITAL_BASED_OUTPATIENT_CLINIC_OR_DEPARTMENT_OTHER): Payer: Self-pay

## 2024-04-20 ENCOUNTER — Other Ambulatory Visit: Payer: Self-pay

## 2024-04-20 LAB — URINALYSIS, ROUTINE W REFLEX MICROSCOPIC
Bacteria, UA: NONE SEEN
Bilirubin Urine: NEGATIVE
Glucose, UA: >1000 mg/dL — AB
Hgb urine dipstick: NEGATIVE
Ketones, ur: 15 mg/dL — AB
Leukocytes,Ua: NEGATIVE
Nitrite: NEGATIVE
Protein, ur: NEGATIVE mg/dL
Specific Gravity, Urine: 1.037 — ABNORMAL HIGH (ref 1.005–1.030)
pH: 5.5 (ref 5.0–8.0)

## 2024-04-20 LAB — CBG MONITORING, ED: Glucose-Capillary: 294 mg/dL — ABNORMAL HIGH (ref 70–99)

## 2024-04-20 LAB — CBC
HCT: 45.3 % (ref 39.0–52.0)
Hemoglobin: 15.2 g/dL (ref 13.0–17.0)
MCH: 30.8 pg (ref 26.0–34.0)
MCHC: 33.6 g/dL (ref 30.0–36.0)
MCV: 91.9 fL (ref 80.0–100.0)
Platelets: 216 K/uL (ref 150–400)
RBC: 4.93 MIL/uL (ref 4.22–5.81)
RDW: 13.1 % (ref 11.5–15.5)
WBC: 7.5 K/uL (ref 4.0–10.5)
nRBC: 0 % (ref 0.0–0.2)

## 2024-04-20 LAB — BASIC METABOLIC PANEL WITH GFR
Anion gap: 11 (ref 5–15)
BUN: 18 mg/dL (ref 8–23)
CO2: 24 mmol/L (ref 22–32)
Calcium: 9 mg/dL (ref 8.9–10.3)
Chloride: 98 mmol/L (ref 98–111)
Creatinine, Ser: 1.41 mg/dL — ABNORMAL HIGH (ref 0.61–1.24)
GFR, Estimated: 57 mL/min — ABNORMAL LOW (ref >60)
Glucose, Bld: 429 mg/dL — ABNORMAL HIGH (ref 70–99)
Potassium: 4.2 mmol/L (ref 3.5–5.1)
Sodium: 132 mmol/L — ABNORMAL LOW (ref 135–145)

## 2024-04-20 MED ORDER — INSULIN ASPART 100 UNIT/ML IJ SOLN
10.0000 [IU] | Freq: Once | INTRAMUSCULAR | Status: AC
  Administered 2024-04-20: 10 [IU] via INTRAVENOUS

## 2024-04-20 MED ORDER — SODIUM CHLORIDE 0.9 % IV BOLUS
1000.0000 mL | Freq: Once | INTRAVENOUS | Status: AC
  Administered 2024-04-20: 1000 mL via INTRAVENOUS

## 2024-04-20 NOTE — ED Provider Notes (Signed)
 [place] EMERGENCY DEPARTMENT AT [facility] Provider Note   CSN: [id number] Arrival date & time: 04/19/24  2332     Patient presents with: Hyperglycemia   Ian Wilson is a 63 y.o. male.   Patient is a 63 year old male with past medical history of hypertension, gout, prior total knee replacement, and type 2 diabetes.  Patient presenting with elevated blood sugar.  He was treated for bronchitis with prednisone  last week.  This evening his sugars were reading as high.  He denies excessive thirst or urination.  He denies any abdominal pain and has no other complaints.       Prior to Admission medications   Medication Sig Start Date End Date Taking? Authorizing Provider  allopurinol  (ZYLOPRIM ) 300 MG tablet Take 1 tablet (300 mg total) by mouth daily. 11/03/19   Amon Aloysius BRAVO, MD  amoxicillin  (AMOXIL ) 500 MG tablet Take 4 tabs po 30 mins before procedure 02/23/19   Stanbery, Mary L, PA-C  aspirin  EC 81 MG tablet Take 1 tablet (81 mg total) by mouth 2 (two) times daily. 09/01/18   Jerri Kay HERO, MD  colchicine  0.6 MG tablet Take on pill po bid until symptoms resolve 09/16/18   Jule Ronal CROME, PA-C  meclizine  (ANTIVERT ) 25 MG tablet Take 1 tablet (25 mg total) by mouth 3 (three) times daily as needed for dizziness. 12/13/23   Elnor Jayson LABOR, DO  omeprazole  (PRILOSEC) 20 MG capsule Take 1 capsule (20 mg total) by mouth daily. 07/08/22   Palumbo, April, MD  ondansetron  (ZOFRAN ) 4 MG tablet Take 1-2 tablets (4-8 mg total) by mouth every 8 (eight) hours as needed for nausea or vomiting. Patient not taking: Reported on 10/10/2018 09/01/18   Jerri Kay HERO, MD  ondansetron  (ZOFRAN -ODT) 8 MG disintegrating tablet 8mg  ODT q8hours prn nausea 07/08/22   Palumbo, April, MD  senna-docusate (SENOKOT S) 8.6-50 MG tablet Take 1 tablet by mouth at bedtime as needed. 09/01/18   Jerri Kay HERO, MD    Allergies: Bee venom, Peanut oil, and Shellfish-derived products    Review of Systems  All  other systems reviewed and are negative.   Updated Vital Signs BP (!) 129/94 (BP Location: Left Arm)   Pulse 100   Temp 98.2 F (36.8 C) (Oral)   Resp 16   SpO2 94%   Physical Exam Vitals and nursing note reviewed.  Constitutional:      General: He is not in acute distress.    Appearance: He is well-developed. He is not diaphoretic.  HENT:     Head: Normocephalic and atraumatic.  Cardiovascular:     Rate and Rhythm: Normal rate and regular rhythm.     Heart sounds: No murmur heard.    No friction rub.  Pulmonary:     Effort: Pulmonary effort is normal. No respiratory distress.     Breath sounds: Normal breath sounds. No wheezing or rales.  Abdominal:     General: Bowel sounds are normal. There is no distension.     Palpations: Abdomen is soft.     Tenderness: There is no abdominal tenderness.  Musculoskeletal:        General: Normal range of motion.     Cervical back: Normal range of motion and neck supple.  Skin:    General: Skin is warm and dry.  Neurological:     Mental Status: He is alert and oriented to person, place, and time.     Coordination: Coordination normal.     (all  labs ordered are listed, but only abnormal results are displayed) Labs Reviewed  URINALYSIS, ROUTINE W REFLEX MICROSCOPIC - Abnormal; Notable for the following components:      Result Value   Specific Gravity, Urine 1.037 (*)    Glucose, UA >1,000 (*)    Ketones, ur 15 (*)    All other components within normal limits  CBG MONITORING, ED - Abnormal; Notable for the following components:   Glucose-Capillary 457 (*)    All other components within normal limits  CBC  BASIC METABOLIC PANEL WITH GFR  CBG MONITORING, ED    EKG: None  Radiology: No results found.   Procedures   Medications Ordered in the ED  sodium chloride  0.9 % bolus 1,000 mL (has no administration in time range)  insulin  regular (NOVOLIN R) 100 units/mL injection 10 Units (has no administration in time range)                                     Medical Decision Making Amount and/or Complexity of Data Reviewed Labs: ordered.  Risk OTC drugs. Prescription drug management.   Patient is a 63 year old male presenting with elevated blood sugar as described in the HPI.  Patient arrives here with stable vital signs and is afebrile.  Physical examination is unremarkable and he is clinically well-appearing.  Laboratory studies obtained including CBC and basic metabolic panel.  Sugar is 457, but laboratory studies otherwise unremarkable.  There is no evidence for ketoacidosis or other electrolyte disturbance.  Patient has been hydrated with normal saline and given NovoLog .  Blood sugar is now 294 and I suspect will continue to drop.  I feel as though patient can safely be discharged with close monitoring of his blood sugar.  I suspect his elevated blood sugars related to the prednisone  therapy he just completed last week for bronchitis.     Final diagnoses:  None    ED Discharge Orders     None          Geroldine Berg, MD 04/20/24 [phone]

## 2024-04-20 NOTE — Discharge Instructions (Signed)
 Continue to monitor your blood sugars at home and keep a record of them.  Follow-up with your primary doctor in the next week to go over the results.  Return to the ER if you develop any new and/or concerning issues.
